# Patient Record
Sex: Male | Born: 1960 | Race: White | Hispanic: No | Marital: Married | Smoking: Never smoker
Health system: Southern US, Community
[De-identification: ages and names within clinical notes are randomized; demographics above are authoritative.]

## PROBLEM LIST (undated history)

## (undated) DIAGNOSIS — K219 Gastro-esophageal reflux disease without esophagitis: Secondary | ICD-10-CM

## (undated) DIAGNOSIS — G5603 Carpal tunnel syndrome, bilateral upper limbs: Secondary | ICD-10-CM

## (undated) HISTORY — DX: Gastro-esophageal reflux disease without esophagitis: K21.9

## (undated) HISTORY — DX: Carpal tunnel syndrome, bilateral upper limbs: G56.03

## (undated) HISTORY — PX: WRIST SURGERY: SHX841

---

## 2002-09-02 ENCOUNTER — Ambulatory Visit (HOSPITAL_BASED_OUTPATIENT_CLINIC_OR_DEPARTMENT_OTHER): Admission: RE | Admit: 2002-09-02 | Discharge: 2002-09-02 | Payer: Self-pay | Admitting: Orthopedic Surgery

## 2002-10-15 ENCOUNTER — Ambulatory Visit (HOSPITAL_BASED_OUTPATIENT_CLINIC_OR_DEPARTMENT_OTHER): Admission: RE | Admit: 2002-10-15 | Discharge: 2002-10-15 | Payer: Self-pay | Admitting: Orthopedic Surgery

## 2012-09-05 ENCOUNTER — Encounter: Payer: Self-pay | Admitting: Physician Assistant

## 2012-09-05 ENCOUNTER — Ambulatory Visit (INDEPENDENT_AMBULATORY_CARE_PROVIDER_SITE_OTHER): Payer: 59 | Admitting: Physician Assistant

## 2012-09-05 VITALS — BP 117/72 | HR 60 | Temp 97.3°F | Ht 71.0 in | Wt 191.4 lb

## 2012-09-05 DIAGNOSIS — K219 Gastro-esophageal reflux disease without esophagitis: Secondary | ICD-10-CM

## 2012-09-05 NOTE — Progress Notes (Signed)
Subjective:     Patient ID: Alan Gill, male   DOB: 03-10-1961, 52 y.o.   MRN: [id number]  HPI Pt with a several month hx of R 4th finger pain and stiffness After further discussion he has prev been seen by Ortho due to a trigger finger of the same He had an injection and sx got better and no more triggering Now he is having pain to the same area Denies any injury He uses OTC NSAIDS intermit for sx  Review of Systems  All other systems reviewed and are negative.       Objective:   Physical Exam  Nursing note and vitals reviewed. No ecchy edema to the finger FROM  w/o triggering today He has TTP along the flexor tendon in the palm Good grip strength Pulses/sensory good     Assessment:     Finger pain/tendonitis     Plan:     Cont OTC NSAIDS Heat/Ice Can try Zostrix OTC If sx cont f/u with Ortho for possible inj

## 2012-09-05 NOTE — Patient Instructions (Signed)
Tendinitis  Tendinitis is swelling and inflammation of the tendons. Tendons are band-like tissues that connect muscle to bone. Tendinitis commonly occurs in the:    Shoulders (rotator cuff).   Heels (Achilles tendon).   Elbows (triceps tendon).  CAUSES  Tendinitis is usually caused by overusing the tendon, muscles, and joints involved. When the tissue surrounding a tendon (synovium) becomes inflamed, it is called tenosynovitis. Tendinitis commonly develops in people whose jobs require repetitive motions.  SYMPTOMS   Pain.   Tenderness.   Mild swelling.  DIAGNOSIS  Tendinitis is usually diagnosed by physical exam. Your caregiver may also order X-rays or other imaging tests.  TREATMENT  Your caregiver may recommend certain medicines or exercises for your treatment.  HOME CARE INSTRUCTIONS    Use a sling or splint for as long as directed by your caregiver until the pain decreases.   Put ice on the injured area.   Put ice in a plastic bag.   Place a towel between your skin and the bag.   Leave the ice on for 15-20 minutes, 3-4 times a day.   Avoid using the limb while the tendon is painful. Perform gentle range of motion exercises only as directed by your caregiver. Stop exercises if pain or discomfort increase, unless directed otherwise by your caregiver.   Only take over-the-counter or prescription medicines for pain, discomfort, or fever as directed by your caregiver.  SEEK MEDICAL CARE IF:    Your pain and swelling increase.   You develop new, unexplained symptoms, especially increased numbness in the hands.  MAKE SURE YOU:    Understand these instructions.   Will watch your condition.   Will get help right away if you are not doing well or get worse.  Document Released: 03/23/2000 Document Revised: 06/18/2011 Document Reviewed: 06/12/2010  [employer] Patient Information 2014 [employer]

## 2013-06-10 ENCOUNTER — Ambulatory Visit: Payer: 59 | Admitting: Family Medicine

## 2013-06-10 VITALS — BP 118/68 | HR 78 | Temp 97.6°F | Ht 71.0 in | Wt 213.0 lb

## 2013-06-10 DIAGNOSIS — Z139 Encounter for screening, unspecified: Secondary | ICD-10-CM

## 2013-06-10 DIAGNOSIS — Z0289 Encounter for other administrative examinations: Secondary | ICD-10-CM

## 2013-06-10 LAB — POCT URINALYSIS DIPSTICK
Bilirubin, UA: NEGATIVE
Blood, UA: NEGATIVE
Glucose, UA: NEGATIVE
Ketones, UA: NEGATIVE
Leukocytes, UA: NEGATIVE
Nitrite, UA: NEGATIVE
Protein, UA: NEGATIVE
Spec Grav, UA: 1.025
Urobilinogen, UA: NEGATIVE
pH, UA: 5

## 2013-06-10 NOTE — Progress Notes (Signed)
   Subjective:    Patient ID: Alan Gill, male    DOB: 05/19/1960, 53 y.o.   MRN: [id number]  HPI  This 53 y.o. male presents for evaluation of DOT PE.  He has no health complaints. He uses corrective lenses to drive.  Review of Systems    No chest pain, SOB, HA, dizziness, vision change, N/V, diarrhea, constipation, dysuria, urinary urgency or frequency, myalgias, arthralgias or rash.  Objective:   Physical Exam  Vital signs noted  Well developed well nourished male.  HEENT - Head atraumatic Normocephalic                Eyes - PERRLA, Conjuctiva - clear Sclera- Clear EOMI                Ears - EAC's Wnl TM's Wnl Gross Hearing WNL                Nose - Nares patent                 Throat - oropharanx wnl Respiratory - Lungs CTA bilateral Cardiac - RRR S1 and S2 without murmur GI - Abdomen soft Nontender and bowel sounds active x 4 Extremities - No edema. Neuro - Grossly intact.  Results for orders placed in visit on 06/10/13  POCT URINALYSIS DIPSTICK      Result Value Ref Range   Color, UA yellow     Clarity, UA clear     Glucose, UA neg     Bilirubin, UA neg     Ketones, UA neg     Spec Grav, UA 1.025     Blood, UA neg     pH, UA 5.0     Protein, UA neg     Urobilinogen, UA negative     Nitrite, UA neg     Leukocytes, UA Negative        Assessment & Plan:  Screening - Plan: POCT urinalysis dipstick  DOT PE - Clear for 2 years.  Deatra CanterWilliam J Oxford FNP

## 2014-02-10 ENCOUNTER — Ambulatory Visit (INDEPENDENT_AMBULATORY_CARE_PROVIDER_SITE_OTHER): Payer: 59 | Admitting: Family Medicine

## 2014-02-10 VITALS — BP 114/69 | HR 61 | Temp 97.2°F | Ht 71.0 in | Wt 203.4 lb

## 2014-02-10 DIAGNOSIS — M6530 Trigger finger, unspecified finger: Secondary | ICD-10-CM

## 2014-02-10 MED ORDER — NAPROXEN 500 MG PO TABS: 500.0000 mg | ORAL_TABLET | Freq: Two times a day (BID) | ORAL | Status: DC

## 2014-02-10 NOTE — Progress Notes (Signed)
   Subjective:    Patient ID: Alan Gill, male    DOB: 01/13/1961, 53 y.o.   MRN: [id number]  HPI C/o trigger finger left 5th finger  Review of Systems  Constitutional: Negative for fever.  HENT: Negative for ear pain.   Eyes: Negative for discharge.  Respiratory: Negative for cough.   Cardiovascular: Negative for chest pain.  Gastrointestinal: Negative for abdominal distention.  Endocrine: Negative for polyuria.  Genitourinary: Negative for difficulty urinating.  Musculoskeletal: Negative for gait problem and neck pain.  Skin: Negative for color change and rash.  Neurological: Negative for speech difficulty and headaches.  Psychiatric/Behavioral: Negative for agitation.       Objective:    BP 114/69 mmHg  Pulse 61  Temp(Src) 97.2 F (36.2 C) (Oral)  Ht 5\' 11"  (1.803 m)  Wt 203 lb 6.4 oz (92.262 kg)  BMI 28.38 kg/m2 Physical Exam  Constitutional: He is oriented to person, place, and time. He appears well-developed and well-nourished.  HENT:  Head: Normocephalic and atraumatic.  Mouth/Throat: Oropharynx is clear and moist.  Eyes: Pupils are equal, round, and reactive to light.  Neck: Normal range of motion. Neck supple.  Cardiovascular: Normal rate and regular rhythm.   No murmur heard. Pulmonary/Chest: Effort normal and breath sounds normal.  Abdominal: Soft. Bowel sounds are normal. There is no tenderness.  Musculoskeletal:  Left 5th trigger finger.  Neurological: He is alert and oriented to person, place, and time.  Skin: Skin is warm and dry.  Psychiatric: He has a normal mood and affect.          Assessment & Plan:     ICD-9-CM ICD-10-CM   1. Trigger finger, acquired 727.03 M65.30 naproxen (NAPROSYN) 500 MG tablet     Ambulatory referral to Orthopedic Surgery     No Follow-up on file.  Deatra CanterWilliam J Mikal Wisman FNP

## 2015-05-26 ENCOUNTER — Encounter: Payer: Self-pay | Admitting: Family Medicine

## 2015-05-26 ENCOUNTER — Ambulatory Visit (INDEPENDENT_AMBULATORY_CARE_PROVIDER_SITE_OTHER): Payer: 59 | Admitting: Family Medicine

## 2015-05-26 VITALS — BP 126/83 | HR 81 | Temp 98.1°F | Ht 71.0 in | Wt 201.8 lb

## 2015-05-26 DIAGNOSIS — J069 Acute upper respiratory infection, unspecified: Secondary | ICD-10-CM

## 2015-05-26 NOTE — Progress Notes (Signed)
   Subjective:    Patient ID: Alan Gill, male    DOB: Sep 15, 1960, 55 y.o.   MRN: [id number]  HPI 55 year old gentleman who was seen in urgent care in [place] over the weekend with symptoms of cough and chills and aching. Denies any fever. Had a flu test which was negative but was treated for a "upper respiratory infection" with Levaquin and prednisone. He feels much better now cough is pretty much subsided.  Patient Active Problem List   Diagnosis Date Noted  . GERD (gastroesophageal reflux disease) 09/05/2012   Outpatient Encounter Prescriptions as of 05/26/2015  Medication Sig  . levofloxacin (LEVAQUIN) 500 MG tablet Take 500 mg by mouth daily.  . [DISCONTINUED] naproxen (NAPROSYN) 500 MG tablet Take 1 tablet (500 mg total) by mouth 2 (two) times daily with a meal.   No facility-administered encounter medications on file as of 05/26/2015.      Review of Systems  Constitutional: Negative.   HENT: Negative.   Eyes: Negative.   Respiratory: Negative.  Negative for shortness of breath.   Cardiovascular: Negative.  Negative for chest pain and leg swelling.  Gastrointestinal: Negative.   Genitourinary: Negative.   Musculoskeletal: Negative.   Skin: Negative.   Neurological: Negative.   Psychiatric/Behavioral: Negative.   All other systems reviewed and are negative.      Objective:   Physical Exam  Constitutional: He appears well-developed and well-nourished.  HENT:  Nose: Nose normal.  Mouth/Throat: Oropharynx is clear and moist.  Neck: Normal range of motion.  Pulmonary/Chest: Effort normal and breath sounds normal.          Assessment & Plan:  1. Acute upper respiratory infection Thinks symptoms have basically resolved. I told him he could probably stop the Levaquin at this point. Eyes sclerae is very healthy. Age 20 there is no suggestion of hypertension and diabetes or lipid issues.  Frederica Kuster MD

## 2015-06-24 ENCOUNTER — Ambulatory Visit: Payer: 59

## 2015-06-27 ENCOUNTER — Encounter: Payer: Self-pay | Admitting: Family Medicine

## 2015-06-27 ENCOUNTER — Ambulatory Visit (INDEPENDENT_AMBULATORY_CARE_PROVIDER_SITE_OTHER): Payer: 59 | Admitting: Family Medicine

## 2015-06-27 ENCOUNTER — Encounter (INDEPENDENT_AMBULATORY_CARE_PROVIDER_SITE_OTHER): Payer: Self-pay

## 2015-06-27 VITALS — BP 124/84 | HR 76 | Temp 98.0°F | Ht 72.0 in | Wt 208.2 lb

## 2015-06-27 DIAGNOSIS — Z111 Encounter for screening for respiratory tuberculosis: Secondary | ICD-10-CM | POA: Diagnosis not present

## 2015-06-27 DIAGNOSIS — Z131 Encounter for screening for diabetes mellitus: Secondary | ICD-10-CM

## 2015-06-27 DIAGNOSIS — Z299 Encounter for prophylactic measures, unspecified: Secondary | ICD-10-CM

## 2015-06-27 DIAGNOSIS — Z13220 Encounter for screening for lipoid disorders: Secondary | ICD-10-CM

## 2015-06-27 DIAGNOSIS — Z0000 Encounter for general adult medical examination without abnormal findings: Secondary | ICD-10-CM

## 2015-06-27 DIAGNOSIS — Z1159 Encounter for screening for other viral diseases: Secondary | ICD-10-CM

## 2015-06-27 DIAGNOSIS — Z114 Encounter for screening for human immunodeficiency virus [HIV]: Secondary | ICD-10-CM

## 2015-06-27 DIAGNOSIS — Z125 Encounter for screening for malignant neoplasm of prostate: Secondary | ICD-10-CM

## 2015-06-27 DIAGNOSIS — Z1211 Encounter for screening for malignant neoplasm of colon: Secondary | ICD-10-CM

## 2015-06-27 NOTE — Progress Notes (Signed)
BP 124/84 mmHg  Pulse 76  Temp(Src) 98 F (36.7 C) (Oral)  Ht 6' (1.829 m)  Wt 208 lb 3.2 oz (94.439 kg)  BMI 28.23 kg/m2   Subjective:    Patient ID: Alan Gill, male    DOB: 1960/11/09, 55 y.o.   MRN: [id number]  HPI: Alan Gill is a 55 y.o. male presenting on 06/27/2015 for Annual Exam   HPI Annual exam and physical Patient is coming in today for annual exam and physical for becoming a foster parent. He denies any chest pain, shortness of breath, headaches or vision issues, abdominal complaints, diarrhea, nausea, vomiting, or joint issues. He denies any health issues or joint issues at all.  Relevant past medical, surgical, family and social history reviewed and updated as indicated. Interim medical history since our last visit reviewed. Allergies and medications reviewed and updated.  Review of Systems  Constitutional: Negative for fever and chills.  HENT: Negative for ear discharge and ear pain.   Eyes: Negative for discharge and visual disturbance.  Respiratory: Negative for chest tightness, shortness of breath and wheezing.   Cardiovascular: Negative for chest pain and leg swelling.  Gastrointestinal: Negative for abdominal pain, diarrhea and constipation.  Endocrine: Negative for cold intolerance and heat intolerance.  Genitourinary: Negative for difficulty urinating.  Musculoskeletal: Negative for back pain and gait problem.  Skin: Negative for rash.  Neurological: Negative for dizziness, syncope, light-headedness and headaches.  All other systems reviewed and are negative.   Per HPI unless specifically indicated above     Medication List    Notice  As of 06/27/2015  4:53 PM   You have not been prescribed any medications.         Objective:    BP 124/84 mmHg  Pulse 76  Temp(Src) 98 F (36.7 C) (Oral)  Ht 6' (1.829 m)  Wt 208 lb 3.2 oz (94.439 kg)  BMI 28.23 kg/m2  Wt Readings from Last 3 Encounters:  06/27/15 208 lb 3.2 oz (94.439 kg)  05/26/15  201 lb 12.8 oz (91.536 kg)  02/10/14 203 lb 6.4 oz (92.262 kg)    Physical Exam  Constitutional: He is oriented to person, place, and time. He appears well-developed and well-nourished. No distress.  HENT:  Right Ear: External ear normal.  Left Ear: External ear normal.  Mouth/Throat: Oropharynx is clear and moist. No oropharyngeal exudate.  Eyes: Conjunctivae and EOM are normal. Pupils are equal, round, and reactive to light. Right eye exhibits no discharge. No scleral icterus.  Neck: Neck supple. No thyromegaly present.  Cardiovascular: Normal rate, regular rhythm, normal heart sounds and intact distal pulses.   No murmur heard. Pulmonary/Chest: Effort normal and breath sounds normal. No respiratory distress. He has no wheezes.  Abdominal: Soft. Bowel sounds are normal. He exhibits no distension. There is no tenderness. There is no rebound.  Musculoskeletal: Normal range of motion. He exhibits no edema.  Lymphadenopathy:    He has no cervical adenopathy.  Neurological: He is alert and oriented to person, place, and time. No cranial nerve deficit. Coordination normal.  Skin: Skin is warm and dry. No rash noted. He is not diaphoretic.  Psychiatric: He has a normal mood and affect. His behavior is normal. Thought content normal.  Vitals reviewed.     Assessment & Plan:   Problem List Items Addressed This Visit    None    Visit Diagnoses    Well adult exam    -  Primary    Need for  prophylactic measure        Relevant Orders    PPD (Completed)    Special screening for malignant neoplasms, colon        Relevant Orders    Ambulatory referral to Gastroenterology    Diabetes mellitus screening        Relevant Orders    CMP14+EGFR    Lipid screening        Relevant Orders    Lipid panel    Prostate cancer screening        Relevant Orders    PSA, total and free    Screening for HIV without presence of risk factors        Relevant Orders    HIV antibody    Need for hepatitis C  screening test        Relevant Orders    Hepatitis C antibody       Follow up plan: Return in about 1 year (around 06/26/2016), or if symptoms worsen or fail to improve.  Counseling provided for all of the vaccine components Orders Placed This Encounter  Procedures  . CMP14+EGFR  . Lipid panel  . Hepatitis C antibody  . HIV antibody  . PSA, total and free  . Ambulatory referral to Gastroenterology  . PPD    Caryl Pina, MD [facility] 06/27/2015, 4:53 PM

## 2015-06-28 LAB — CMP14+EGFR
A/G RATIO: 1.8 (ref 1.2–2.2)
ALT: 15 IU/L (ref 0–44)
AST: 20 IU/L (ref 0–40)
Albumin: 4.4 g/dL (ref 3.5–5.5)
Alkaline Phosphatase: 46 IU/L (ref 39–117)
BUN / CREAT RATIO: 25 — AB (ref 9–20)
BUN: 14 mg/dL (ref 6–24)
Bilirubin Total: 0.4 mg/dL (ref 0.0–1.2)
CO2: 24 mmol/L (ref 18–29)
Calcium: 9.9 mg/dL (ref 8.7–10.2)
Chloride: 99 mmol/L (ref 96–106)
Creatinine, Ser: 0.57 mg/dL — ABNORMAL LOW (ref 0.76–1.27)
GFR calc Af Amer: 136 mL/min/{1.73_m2} (ref >59)
GFR, EST NON AFRICAN AMERICAN: 118 mL/min/{1.73_m2} (ref >59)
GLOBULIN, TOTAL: 2.4 g/dL (ref 1.5–4.5)
Glucose: 84 mg/dL (ref 65–99)
POTASSIUM: 4.1 mmol/L (ref 3.5–5.2)
Sodium: 141 mmol/L (ref 134–144)
TOTAL PROTEIN: 6.8 g/dL (ref 6.0–8.5)

## 2015-06-28 LAB — PSA, TOTAL AND FREE
PROSTATE SPECIFIC AG, SERUM: 0.7 ng/mL (ref 0.0–4.0)
PSA FREE: 0.14 ng/mL
PSA, Free Pct: 20 %

## 2015-06-28 LAB — HIV ANTIBODY (ROUTINE TESTING W REFLEX): HIV SCREEN 4TH GENERATION: NONREACTIVE

## 2015-06-28 LAB — HCV ANTIBODY

## 2015-06-28 LAB — LIPID PANEL
CHOL/HDL RATIO: 2.6 ratio (ref 0.0–5.0)
Cholesterol, Total: 144 mg/dL (ref 100–199)
HDL: 55 mg/dL (ref >39)
LDL CALC: 62 mg/dL (ref 0–99)
TRIGLYCERIDES: 136 mg/dL (ref 0–149)
VLDL Cholesterol Cal: 27 mg/dL (ref 5–40)

## 2015-06-30 ENCOUNTER — Ambulatory Visit: Payer: 59 | Admitting: *Deleted

## 2015-06-30 DIAGNOSIS — Z111 Encounter for screening for respiratory tuberculosis: Secondary | ICD-10-CM

## 2015-06-30 LAB — TB SKIN TEST
Induration: 0 mm
TB SKIN TEST: NEGATIVE

## 2015-06-30 NOTE — Progress Notes (Signed)
Pt came in to have PPD read. PPD negative, form signed and given to pt.

## 2015-07-06 ENCOUNTER — Telehealth: Payer: Self-pay

## 2015-07-06 NOTE — Telephone Encounter (Signed)
I triaged pt and he will need to get some dates that his wife is off and call me back. His problem is gong to be having a ride.

## 2015-07-06 NOTE — Telephone Encounter (Signed)
Patient called back again asking for DS. I told him that she was doing her triages and would get to him as soon as she could and she was aware that he had called.

## 2015-07-06 NOTE — Telephone Encounter (Signed)
Pt received a triage letter from DS. 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 [phone]

## 2015-07-07 NOTE — Telephone Encounter (Signed)
Pt called back and could not work out anything on any dates that his wife is off. He will get some more dates and call when he can to get scheduled.

## 2015-12-20 ENCOUNTER — Other Ambulatory Visit: Payer: Self-pay | Admitting: *Deleted

## 2015-12-20 ENCOUNTER — Telehealth: Payer: Self-pay | Admitting: Family Medicine

## 2015-12-20 DIAGNOSIS — Z1211 Encounter for screening for malignant neoplasm of colon: Secondary | ICD-10-CM

## 2015-12-20 NOTE — Telephone Encounter (Signed)
Referral placed.

## 2015-12-27 ENCOUNTER — Telehealth: Payer: Self-pay

## 2015-12-27 NOTE — Telephone Encounter (Signed)
Pt received a triage letter from DS. Please call him at [phone]

## 2016-01-05 NOTE — Telephone Encounter (Signed)
I spoke to pt and he has had blood in stool and on tissue sometimes. OV scheduled with Tana CoastLeslie Lewis, PA on 01/27/2016 at 8:30 Am.

## 2016-01-05 NOTE — Telephone Encounter (Signed)
Patient called wanting to get scheduled for his tcs.  Routing to [employer]

## 2016-01-27 ENCOUNTER — Ambulatory Visit (INDEPENDENT_AMBULATORY_CARE_PROVIDER_SITE_OTHER): Payer: 59 | Admitting: Gastroenterology

## 2016-01-27 ENCOUNTER — Encounter: Payer: Self-pay | Admitting: Gastroenterology

## 2016-01-27 ENCOUNTER — Other Ambulatory Visit: Payer: Self-pay

## 2016-01-27 DIAGNOSIS — K625 Hemorrhage of anus and rectum: Secondary | ICD-10-CM | POA: Diagnosis not present

## 2016-01-27 MED ORDER — NA SULFATE-K SULFATE-MG SULF 17.5-3.13-1.6 GM/177ML PO SOLN: 1.0000 | ORAL | 0 refills | Status: DC

## 2016-01-27 NOTE — Patient Instructions (Signed)
1. Colonoscopy as scheduled. See separate instructions.  

## 2016-01-27 NOTE — Progress Notes (Signed)
Primary Care Physician:  MILLER, STEPHEN M, MD  Primary Gastroenterologist:  Michael Rourk, MD   Chief Complaint  Patient presents with  . Colonoscopy    HPI:  Alan Gill is a 55 y.o. male here to schedule colonoscopy. This will be his first ever colonoscopy. No family history colon cancer. He reports seeing bright red blood per rectum on occasion. Overall has regular bowel movements. No melena. No rectal pain. No abdominal pain. Rarely has heartburn. No dysphagia, weight loss, vomiting. No regular medications.  No current outpatient prescriptions on file.   No current facility-administered medications for this visit.     Allergies as of 01/27/2016  . (No Known Allergies)    Past Medical History:  Diagnosis Date  . Carpal tunnel syndrome on both sides   . GERD (gastroesophageal reflux disease)     Past Surgical History:  Procedure Laterality Date  . WRIST SURGERY      Family History  Problem Relation Age of Onset  . Healthy Mother   . Healthy Father   . Bladder Cancer Father   . Healthy Sister   . Cancer Maternal Grandmother     breast  . Early death Sister   . Drug abuse Son   . Bladder Cancer Paternal Uncle   . Colon cancer Neg Hx     Social History   Social History  . Marital status: Married    Spouse name: N/A  . Number of children: N/A  . Years of education: N/A   Occupational History  . Not on file.   Social History Main Topics  . Smoking status: Never Smoker  . Smokeless tobacco: Never Used  . Alcohol use No  . Drug use: No  . Sexual activity: Not on file   Other Topics Concern  . Not on file   Social History Narrative   Work [employer] telephone [employer]      ROS:  General: Negative for anorexia, weight loss, fever, chills, fatigue, weakness. Eyes: Negative for vision changes.  ENT: Negative for hoarseness, difficulty swallowing , nasal congestion. CV: Negative for chest pain, angina, palpitations, dyspnea on exertion, peripheral  edema.  Respiratory: Negative for dyspnea at rest, dyspnea on exertion, cough, sputum, wheezing.  GI: See history of present illness. GU:  Negative for dysuria, hematuria, urinary incontinence, urinary frequency, nocturnal urination.  MS: Negative for joint pain, low back pain.  Derm: Negative for rash or itching.  Neuro: Negative for weakness, abnormal sensation, seizure, frequent headaches, memory loss, confusion.  Psych: Negative for anxiety, depression, suicidal ideation, hallucinations.  Endo: Negative for unusual weight change.  Heme: Negative for bruising or bleeding. Allergy: Negative for rash or hives.    Physical Examination:  BP 128/87   Pulse 65   Temp 97 F (36.1 C) (Oral)   Ht 6' (1.829 m)   Wt 203 lb 6.4 oz (92.3 kg)   BMI 27.59 kg/m    General: Well-nourished, well-developed in no acute distress.  Head: Normocephalic, atraumatic.   Eyes: Conjunctiva pink, no icterus. Mouth: Oropharyngeal mucosa moist and pink , no lesions erythema or exudate. Neck: Supple without thyromegaly, masses, or lymphadenopathy.  Lungs: Clear to auscultation bilaterally.  Heart: Regular rate and rhythm, no murmurs rubs or gallops.  Abdomen: Bowel sounds are normal, nontender, nondistended, no hepatosplenomegaly or masses, no abdominal bruits or    hernia , no rebound or guarding.   Rectal: Not performed Extremities: No lower extremity edema. No clubbing or deformities.  Neuro: Alert and oriented x   4 , grossly normal neurologically.  Skin: Warm and dry, no rash or jaundice.   Psych: Alert and cooperative, normal mood and affect.  Labs: Lab Results  Component Value Date   CREATININE 0.57 (L) 06/27/2015   BUN 14 06/27/2015   NA 141 06/27/2015   K 4.1 06/27/2015   CL 99 06/27/2015   CO2 24 06/27/2015   Lab Results  Component Value Date   ALT 15 06/27/2015   AST 20 06/27/2015   ALKPHOS 46 06/27/2015   BILITOT 0.4 06/27/2015   No results found for: WBC, HGB, HCT, MCV,  PLT   Imaging Studies: No results found.

## 2016-01-27 NOTE — Assessment & Plan Note (Signed)
55 year old gentleman presenting with intermittent rectal bleeding. Due for colonoscopy. Phenergan 25 mg IV 30 minutes before the procedure augment conscious sedation.  I have discussed the risks, alternatives, benefits with regards to but not limited to the risk of reaction to medication, bleeding, infection, perforation and the patient is agreeable to proceed. Written consent to be obtained.

## 2016-01-27 NOTE — Progress Notes (Signed)
CC'ED TO PCP 

## 2016-01-30 NOTE — Patient Instructions (Signed)
PA # for TCS  [id number]

## 2016-02-20 ENCOUNTER — Encounter (HOSPITAL_COMMUNITY): Payer: Self-pay | Admitting: *Deleted

## 2016-02-20 ENCOUNTER — Ambulatory Visit (HOSPITAL_COMMUNITY)
Admission: RE | Admit: 2016-02-20 | Discharge: 2016-02-20 | Disposition: A | Discharge status: Home / Self Care | Payer: 59 | Source: Ambulatory Visit | Attending: Internal Medicine | Admitting: Internal Medicine

## 2016-02-20 ENCOUNTER — Encounter (HOSPITAL_COMMUNITY)
Admission: RE | Disposition: A | Discharge status: Home / Self Care | Payer: Self-pay | Source: Ambulatory Visit | Attending: Internal Medicine

## 2016-02-20 DIAGNOSIS — K921 Melena: Secondary | ICD-10-CM | POA: Diagnosis not present

## 2016-02-20 DIAGNOSIS — K640 First degree hemorrhoids: Secondary | ICD-10-CM | POA: Diagnosis not present

## 2016-02-20 DIAGNOSIS — K625 Hemorrhage of anus and rectum: Secondary | ICD-10-CM | POA: Diagnosis present

## 2016-02-20 HISTORY — PX: COLONOSCOPY: SHX5424

## 2016-02-20 SURGERY — COLONOSCOPY
Anesthesia: Moderate Sedation

## 2016-02-20 MED ORDER — MEPERIDINE HCL 100 MG/ML IJ SOLN
INTRAMUSCULAR | Status: AC
  Filled 2016-02-20: qty 2

## 2016-02-20 MED ORDER — MEPERIDINE HCL 100 MG/ML IJ SOLN
INTRAMUSCULAR | Status: DC | PRN
  Administered 2016-02-20: 25 mg via INTRAVENOUS
  Administered 2016-02-20: 50 mg via INTRAVENOUS

## 2016-02-20 MED ORDER — ONDANSETRON HCL 4 MG/2ML IJ SOLN
INTRAMUSCULAR | Status: AC
  Filled 2016-02-20: qty 2

## 2016-02-20 MED ORDER — ONDANSETRON HCL 4 MG/2ML IJ SOLN
INTRAMUSCULAR | Status: DC | PRN
  Administered 2016-02-20: 4 mg via INTRAVENOUS

## 2016-02-20 MED ORDER — SODIUM CHLORIDE 0.9 % IV SOLN
INTRAVENOUS | Status: DC
  Administered 2016-02-20: 11:00:00 via INTRAVENOUS

## 2016-02-20 MED ORDER — MIDAZOLAM HCL 5 MG/5ML IJ SOLN
INTRAMUSCULAR | Status: AC
  Filled 2016-02-20: qty 10

## 2016-02-20 MED ORDER — MIDAZOLAM HCL 5 MG/5ML IJ SOLN
INTRAMUSCULAR | Status: DC | PRN
  Administered 2016-02-20: 2 mg via INTRAVENOUS
  Administered 2016-02-20: 1 mg via INTRAVENOUS

## 2016-02-20 MED ORDER — PROMETHAZINE HCL 25 MG/ML IJ SOLN
INTRAMUSCULAR | Status: AC
  Filled 2016-02-20: qty 1

## 2016-02-20 MED ORDER — PROMETHAZINE HCL 25 MG/ML IJ SOLN
25.0000 mg | Freq: Once | INTRAMUSCULAR | Status: AC
  Administered 2016-02-20: 25 mg via INTRAVENOUS

## 2016-02-20 MED ORDER — SODIUM CHLORIDE 0.9% FLUSH
INTRAVENOUS | Status: AC
  Filled 2016-02-20: qty 10

## 2016-02-20 NOTE — Op Note (Signed)
[facility] Patient Name: Alan Gill Procedure Date: 02/20/2016 12:15 PM MRN: [id number] Date of Birth: 04/23/1960 Attending MD: Gennette Pacobert Michael Rourk , MD CSN: [id number] Age: [id number] Admit Type: Outpatient Procedure:                Colonoscopy - diagnostic Indications:              Hematochezia Providers:                Gennette Pacobert Michael Rourk, MD, Jannett CelestineAnitra Bell, RN, Birder Robsonebra                            [place], Technician Referring MD:              Medicines:                Midazolam 3 mg IV, Meperidine 75 mg IV Complications:            No immediate complications. Estimated Blood Loss:     Estimated blood loss: none. Procedure:                Pre-Anesthesia Assessment:                           - Prior to the procedure, a History and Physical                            was performed, and patient medications and                            allergies were reviewed. The patient's tolerance of                            previous anesthesia was also reviewed. The risks                            and benefits of the procedure and the sedation                            options and risks were discussed with the patient.                            All questions were answered, and informed consent                            was obtained. Prior Anticoagulants: The patient has                            taken no previous anticoagulant or antiplatelet                            agents. ASA Grade Assessment: II - A patient with                            mild systemic disease. After reviewing the risks  and benefits, the patient was deemed in                            satisfactory condition to undergo the procedure.                           After obtaining informed consent, the colonoscope                            was passed under direct vision. Throughout the                            procedure, the patient's blood pressure, pulse, and                            oxygen  saturations were monitored continuously. The                            EC-[id number] ([id number]) scope was introduced through                            the anus and advanced to the the cecum, identified                            by appendiceal orifice and ileocecal valve. The                            colonoscopy was performed without difficulty. The                            patient tolerated the procedure well. The quality                            of the bowel preparation was adequate. The entire                            colon was well visualized. The ileocecal valve,                            appendiceal orifice, and rectum were photographed. Scope In: 12:29:26 PM Scope Out: 12:41:20 PM Scope Withdrawal Time: 0 hours 8 minutes 29 seconds  Total Procedure Duration: 0 hours 11 minutes 54 seconds  Findings:      The perianal and digital rectal examinations were normal.      Internal hemorrhoids were found during retroflexion. The hemorrhoids       were Grade I (internal hemorrhoids that do not prolapse).      The colon (entire examined portion) appeared normal.      No additional abnormalities were found on retroflexion. Impression:               - Internal hemorrhoids - likely source of                            hemtochezia                           -  The entire examined colon is normal.                           - No specimens collected. Moderate Sedation:      Moderate (conscious) sedation was administered by the endoscopy nurse       and supervised by the endoscopist. The following parameters were       monitored: oxygen saturation, heart rate, blood pressure, respiratory       rate, EKG, adequacy of pulmonary ventilation, and response to care.       Total physician intraservice time was 17 minutes. Recommendation:           - Patient has a contact number available for                            emergencies. The signs and symptoms of potential                             delayed complications were discussed with the                            patient. Return to normal activities tomorrow.                            Written discharge instructions were provided to the                            patient.                           - Resume previous diet.                           - Continue present medications. Two-week course of                            Anusol HC suppositories 1 per rectum. Benefiber 1                            tablespoon twice daly to regimen.                           - Repeat colonoscopy in 10 years for screening                            purposes.                           - Return to GI office in 8 weeks. Patient would be                            a reasonably good hemorrhoid banding candidate if                            needed. Procedure Code(s):        --- Professional ---  [id number], Colonoscopy, flexible; diagnostic, including                            collection of specimen(s) by brushing or washing,                            when performed (separate procedure)                           99152, Moderate sedation services provided by the                            same physician or other qualified health care                            professional performing the diagnostic or                            therapeutic service that the sedation supports,                            requiring the presence of an independent trained                            observer to assist in the monitoring of the                            patient's level of consciousness and physiological                            status; initial 15 minutes of intraservice time,                            patient age 5 years or older Diagnosis Code(s):        --- Professional ---                           K64.0, First degree hemorrhoids                           K92.1, Melena (includes Hematochezia) CPT copyright 2016 American Medical  Association. All rights reserved. The codes documented in this report are preliminary and upon coder review may  be revised to meet current compliance requirements. Gerrit Friendsobert M. Rourk, MD Gennette Pacobert Michael Rourk, MD 02/20/2016 12:53:21 PM This report has been signed electronically. Number of Addenda: 0

## 2016-02-20 NOTE — H&P (View-Only) (Signed)
Primary Care Physician:  Frederica KusterMILLER, STEPHEN M, MD  Primary Gastroenterologist:  Roetta SessionsMichael Rourk, MD   Chief Complaint  Patient presents with  . Colonoscopy    HPI:  Alan Gill is a 55 y.o. male here to schedule colonoscopy. This will be his first ever colonoscopy. No family history colon cancer. He reports seeing bright red blood per rectum on occasion. Overall has regular bowel movements. No melena. No rectal pain. No abdominal pain. Rarely has heartburn. No dysphagia, weight loss, vomiting. No regular medications.  No current outpatient prescriptions on file.   No current facility-administered medications for this visit.     Allergies as of 01/27/2016  . (No Known Allergies)    Past Medical History:  Diagnosis Date  . Carpal tunnel syndrome on both sides   . GERD (gastroesophageal reflux disease)     Past Surgical History:  Procedure Laterality Date  . WRIST SURGERY      Family History  Problem Relation Age of Onset  . Healthy Mother   . Healthy Father   . Bladder Cancer Father   . Healthy Sister   . Cancer Maternal Grandmother     breast  . Early death Sister   . Drug abuse Son   . Bladder Cancer Paternal Uncle   . Colon cancer Neg Hx     Social History   Social History  . Marital status: Married    Spouse name: N/A  . Number of children: N/A  . Years of education: N/A   Occupational History  . Not on file.   Social History Main Topics  . Smoking status: Never Smoker  . Smokeless tobacco: Never Used  . Alcohol use No  . Drug use: No  . Sexual activity: Not on file   Other Topics Concern  . Not on file   Social History Narrative   Work [employer]      ROS:  General: Negative for anorexia, weight loss, fever, chills, fatigue, weakness. Eyes: Negative for vision changes.  ENT: Negative for hoarseness, difficulty swallowing , nasal congestion. CV: Negative for chest pain, angina, palpitations, dyspnea on exertion, peripheral  edema.  Respiratory: Negative for dyspnea at rest, dyspnea on exertion, cough, sputum, wheezing.  GI: See history of present illness. GU:  Negative for dysuria, hematuria, urinary incontinence, urinary frequency, nocturnal urination.  MS: Negative for joint pain, low back pain.  Derm: Negative for rash or itching.  Neuro: Negative for weakness, abnormal sensation, seizure, frequent headaches, memory loss, confusion.  Psych: Negative for anxiety, depression, suicidal ideation, hallucinations.  Endo: Negative for unusual weight change.  Heme: Negative for bruising or bleeding. Allergy: Negative for rash or hives.    Physical Examination:  BP 128/87   Pulse 65   Temp 97 F (36.1 C) (Oral)   Ht 6' (1.829 m)   Wt 203 lb 6.4 oz (92.3 kg)   BMI 27.59 kg/m    General: Well-nourished, well-developed in no acute distress.  Head: Normocephalic, atraumatic.   Eyes: Conjunctiva pink, no icterus. Mouth: Oropharyngeal mucosa moist and pink , no lesions erythema or exudate. Neck: Supple without thyromegaly, masses, or lymphadenopathy.  Lungs: Clear to auscultation bilaterally.  Heart: Regular rate and rhythm, no murmurs rubs or gallops.  Abdomen: Bowel sounds are normal, nontender, nondistended, no hepatosplenomegaly or masses, no abdominal bruits or    hernia , no rebound or guarding.   Rectal: Not performed Extremities: No lower extremity edema. No clubbing or deformities.  Neuro: Alert and oriented x  4 , grossly normal neurologically.  Skin: Warm and dry, no rash or jaundice.   Psych: Alert and cooperative, normal mood and affect.  Labs: Lab Results  Component Value Date   CREATININE 0.57 (L) 06/27/2015   BUN 14 06/27/2015   NA 141 06/27/2015   K 4.1 06/27/2015   CL 99 06/27/2015   CO2 24 06/27/2015   Lab Results  Component Value Date   ALT 15 06/27/2015   AST 20 06/27/2015   ALKPHOS 46 06/27/2015   BILITOT 0.4 06/27/2015   No results found for: WBC, HGB, HCT, MCV,  PLT   Imaging Studies: No results found.

## 2016-02-20 NOTE — Interval H&P Note (Signed)
History and Physical Interval Note:  02/20/2016 12:22 PM  Margaretmary DysEugene Altamura  has presented today for surgery, with the diagnosis of RECTAL BLEEDING  The various methods of treatment have been discussed with the patient and family. After consideration of risks, benefits and other options for treatment, the patient has consented to  Procedure(s) with comments: COLONOSCOPY (N/A) - 200 as a surgical intervention .  The patient's history has been reviewed, patient examined, no change in status, stable for surgery.  I have reviewed the patient's chart and labs.  Questions were answered to the patient's satisfaction.      No change. Diagnostic colonoscopy for plan.  The risks, benefits, limitations, alternatives and imponderables have been reviewed with the patient. Questions have been answered. All parties are agreeable.  Eula Listenobert Emylie Amster

## 2016-02-20 NOTE — Discharge Instructions (Addendum)
Colonoscopy Discharge Instructions  Read the instructions outlined below and refer to this sheet in the next few weeks. These discharge instructions provide you with general information on caring for yourself after you leave the hospital. Your doctor may also give you specific instructions. While your treatment has been planned according to the most current medical practices available, unavoidable complications occasionally occur. If you have any problems or questions after discharge, call Dr. Jena Gaussourk at [phone]. ACTIVITY  You may resume your regular activity, but move at a slower pace for the next 24 hours.   Take frequent rest periods for the next 24 hours.   Walking will help get rid of the air and reduce the bloated feeling in your belly (abdomen).   No driving for 24 hours (because of the medicine (anesthesia) used during the test).    Do not sign any important legal documents or operate any machinery for 24 hours (because of the anesthesia used during the test).  NUTRITION  Drink plenty of fluids.   You may resume your normal diet as instructed by your doctor.   Begin with a light meal and progress to your normal diet. Heavy or fried foods are harder to digest and may make you feel sick to your stomach (nauseated).   Avoid alcoholic beverages for 24 hours or as instructed.  MEDICATIONS  You may resume your normal medications unless your doctor tells you otherwise.  WHAT YOU CAN EXPECT TODAY  Some feelings of bloating in the abdomen.   Passage of more gas than usual.   Spotting of blood in your stool or on the toilet paper.  IF YOU HAD POLYPS REMOVED DURING THE COLONOSCOPY:  No aspirin products for 7 days or as instructed.   No alcohol for 7 days or as instructed.   Eat a soft diet for the next 24 hours.  FINDING OUT THE RESULTS OF YOUR TEST Not all test results are available during your visit. If your test results are not back during the visit, make an appointment  with your caregiver to find out the results. Do not assume everything is normal if you have not heard from your caregiver or the medical facility. It is important for you to follow up on all of your test results.  SEEK IMMEDIATE MEDICAL ATTENTION IF:  You have more than a spotting of blood in your stool.   Your belly is swollen (abdominal distention).   You are nauseated or vomiting.   You have a temperature over 101.   You have abdominal pain or discomfort that is severe or gets worse throughout the day.      Hemorrhoid information provided  Pamphlet on hemorrhoid banding provided  Repeat colonoscopy for screening purposes in 10 years  Two-week course of Anusol suppositories 1 per rectum at bedtime  Benefiber 1 tablespoon twice daily  If bleeding not improved, can consider hemorrhoid banding in the office. Office visit with us in 6-8 weeks  Hemorrhoids Hemorrhoids are swollen veins around the rectum or anus. There are two types of hemorrhoids:   Internal hemorrhoids. These occur in the veins just inside the rectum. They may poke through to the outside and become irritated and painful.  External hemorrhoids. These occur in the veins outside the anus and can be felt as a painful swelling or hard lump near the anus. CAUSES  Pregnancy.   Obesity.   Constipation or diarrhea.   Straining to have a bowel movement.   Sitting for long periods on the  toilet.  Heavy lifting or other activity that caused you to strain.  Anal intercourse. SYMPTOMS   Pain.   Anal itching or irritation.   Rectal bleeding.   Fecal leakage.   Anal swelling.   One or more lumps around the anus.  DIAGNOSIS  Your caregiver may be able to diagnose hemorrhoids by visual examination. Other examinations or tests that may be performed include:   Examination of the rectal area with a gloved hand (digital rectal exam).   Examination of anal canal using a small tube (scope).   A  blood test if you have lost a significant amount of blood.  A test to look inside the colon (sigmoidoscopy or colonoscopy). TREATMENT Most hemorrhoids can be treated at home. However, if symptoms do not seem to be getting better or if you have a lot of rectal bleeding, your caregiver may perform a procedure to help make the hemorrhoids get smaller or remove them completely. Possible treatments include:   Placing a rubber band at the base of the hemorrhoid to cut off the circulation (rubber band ligation).   Injecting a chemical to shrink the hemorrhoid (sclerotherapy).   Using a tool to burn the hemorrhoid (infrared light therapy).   Surgically removing the hemorrhoid (hemorrhoidectomy).   Stapling the hemorrhoid to block blood flow to the tissue (hemorrhoid stapling).  HOME CARE INSTRUCTIONS   Eat foods with fiber, such as whole grains, beans, nuts, fruits, and vegetables. Ask your doctor about taking products with added fiber in them (fibersupplements).  Increase fluid intake. Drink enough water and fluids to keep your urine clear or pale yellow.   Exercise regularly.   Go to the bathroom when you have the urge to have a bowel movement. Do not wait.   Avoid straining to have bowel movements.   Keep the anal area dry and clean. Use wet toilet paper or moist towelettes after a bowel movement.   Medicated creams and suppositories may be used or applied as directed.   Only take over-the-counter or prescription medicines as directed by your caregiver.   Take warm sitz baths for 15-20 minutes, 3-4 times a day to ease pain and discomfort.   Place ice packs on the hemorrhoids if they are tender and swollen. Using ice packs between sitz baths may be helpful.   Put ice in a plastic bag.   Place a towel between your skin and the bag.   Leave the ice on for 15-20 minutes, 3-4 times a day.   Do not use a donut-shaped pillow or sit on the toilet for long periods. This  increases blood pooling and pain.  SEEK MEDICAL CARE IF:  You have increasing pain and swelling that is not controlled by treatment or medicine.  You have uncontrolled bleeding.  You have difficulty or you are unable to have a bowel movement.  You have pain or inflammation outside the area of the hemorrhoids. MAKE SURE YOU:  Understand these instructions.  Will watch your condition.  Will get help right away if you are not doing well or get worse.   This information is not intended to replace advice given to you by your health care provider. Make sure you discuss any questions you have with your health care provider.   Document Released: 03/23/2000 Document Revised: 03/12/2012 Document Reviewed: 01/29/2012 Elsevier Interactive Patient Education [employer]2016 [employer]

## 2016-02-22 ENCOUNTER — Encounter (HOSPITAL_COMMUNITY): Payer: Self-pay | Admitting: Internal Medicine

## 2016-03-13 ENCOUNTER — Encounter: Payer: Self-pay | Admitting: Nurse Practitioner

## 2016-04-10 ENCOUNTER — Ambulatory Visit: Payer: 59 | Admitting: Nurse Practitioner

## 2016-04-12 ENCOUNTER — Ambulatory Visit: Payer: 59 | Admitting: Nurse Practitioner

## 2016-05-07 ENCOUNTER — Ambulatory Visit: Payer: 59 | Admitting: Nurse Practitioner

## 2020-05-23 ENCOUNTER — Other Ambulatory Visit: Payer: Self-pay

## 2020-05-23 ENCOUNTER — Ambulatory Visit (INDEPENDENT_AMBULATORY_CARE_PROVIDER_SITE_OTHER): Payer: 59 | Admitting: Urology

## 2020-05-23 ENCOUNTER — Ambulatory Visit (HOSPITAL_COMMUNITY)
Admission: RE | Admit: 2020-05-23 | Discharge: 2020-05-23 | Disposition: A | Discharge status: Home / Self Care | Payer: 59 | Source: Ambulatory Visit | Attending: Urology | Admitting: Urology

## 2020-05-23 ENCOUNTER — Encounter: Payer: Self-pay | Admitting: Urology

## 2020-05-23 ENCOUNTER — Encounter (HOSPITAL_COMMUNITY)
Admission: RE | Admit: 2020-05-23 | Discharge: 2020-05-23 | Disposition: A | Discharge status: Home / Self Care | Payer: 59 | Source: Ambulatory Visit | Attending: Urology | Admitting: Urology

## 2020-05-23 ENCOUNTER — Encounter (HOSPITAL_COMMUNITY): Payer: Self-pay

## 2020-05-23 ENCOUNTER — Other Ambulatory Visit (HOSPITAL_COMMUNITY)
Admission: RE | Admit: 2020-05-23 | Discharge: 2020-05-23 | Disposition: A | Discharge status: Home / Self Care | Payer: 59 | Source: Ambulatory Visit | Attending: Urology | Admitting: Urology

## 2020-05-23 VITALS — BP 133/72 | HR 79 | Temp 98.5°F | Ht 72.0 in | Wt 221.0 lb

## 2020-05-23 DIAGNOSIS — N200 Calculus of kidney: Secondary | ICD-10-CM | POA: Insufficient documentation

## 2020-05-23 DIAGNOSIS — Z20822 Contact with and (suspected) exposure to covid-19: Secondary | ICD-10-CM | POA: Insufficient documentation

## 2020-05-23 DIAGNOSIS — Z01812 Encounter for preprocedural laboratory examination: Secondary | ICD-10-CM | POA: Insufficient documentation

## 2020-05-23 LAB — URINALYSIS, ROUTINE W REFLEX MICROSCOPIC
Bilirubin, UA: NEGATIVE
Glucose, UA: NEGATIVE
Ketones, UA: NEGATIVE
Nitrite, UA: NEGATIVE
Specific Gravity, UA: 1.02 (ref 1.005–1.030)
Urobilinogen, Ur: 0.2 mg/dL (ref 0.2–1.0)
pH, UA: 5 (ref 5.0–7.5)

## 2020-05-23 LAB — MICROSCOPIC EXAMINATION
RBC, Urine: >30 /hpf — AB (ref 0–2)
Renal Epithel, UA: NONE SEEN /hpf

## 2020-05-23 MED ORDER — OXYCODONE-ACETAMINOPHEN 5-325 MG PO TABS: 1.0000 | ORAL_TABLET | Freq: Four times a day (QID) | ORAL | 0 refills | Status: DC | PRN

## 2020-05-23 MED ORDER — ONDANSETRON 4 MG PO TBDP: 4.0000 mg | ORAL_TABLET | Freq: Three times a day (TID) | ORAL | 0 refills | Status: DC | PRN

## 2020-05-23 NOTE — Patient Instructions (Signed)
Your procedure is scheduled on: 05/24/2020  Report to [facility] at   9:00  AM.  Call this number if you have problems the morning of surgery: [phone]   Remember:   Do not Eat or Drink after midnight         No Smoking the morning of surgery  :  Take these medicines the morning of surgery with A SIP OF WATER: oxycodone and flomax if needed   Do not wear jewelry, make-up or nail polish.  Do not wear lotions, powders, or perfumes. You may wear deodorant.  Do not shave 48 hours prior to surgery. Men may shave face and neck.  Do not bring valuables to the hospital.  Contacts, dentures or bridgework may not be worn into surgery.  Leave suitcase in the car. After surgery it may be brought to your room.  For patients admitted to the hospital, checkout time is 11:00 AM the day of discharge.   Patients discharged the day of surgery will not be allowed to drive home.     Lithotripsy, Care After This sheet gives you information about how to care for yourself after your procedure. Your health care provider may also give you more specific instructions. If you have problems or questions, contact your health care provider. What can I expect after the procedure? After the procedure, it is common to have:  Some blood in your urine. This should only last for a few days.  Soreness in your back, sides, or upper abdomen for a few days.  Blotches or bruises on the area where the shock wave entered the skin.  Pain, discomfort, or nausea when pieces (fragments) of the kidney stone move through the tube that carries urine from the kidney to the bladder (ureter). Stone fragments may pass soon after the procedure, but they may continue to pass for up to 4-8 weeks. ? If you have severe pain or nausea, contact your health care provider. This may be caused by a large stone that was not broken up, and this may mean that you need more treatment.  Some pain or discomfort during urination.  Some pain or  discomfort in the lower abdomen or (in men) at the base of the penis. Follow these instructions at home: Medicines  Take over-the-counter and prescription medicines only as told by your health care provider.  If you were prescribed an antibiotic medicine, take it as told by your health care provider. Do not stop taking the antibiotic even if you start to feel better.  Ask your health care provider if the medicine prescribed to you requires you to avoid driving or using machinery. Eating and drinking  Drink enough fluid to keep your urine pale yellow. This helps any remaining pieces of the stone to pass. It can also help prevent new stones from forming.  Eat plenty of fresh fruits and vegetables.  Follow instructions from your health care provider about eating or drinking restrictions. You may be instructed to: ? Reduce how much salt (sodium) you eat or drink. Check ingredients and nutrition facts on packaged foods and beverages to see how much sodium they contain. ? Reduce how much meat you eat.  Eat the recommended amount of calcium for your age and gender. Ask your health care provider how much calcium you should have.      General instructions  Get plenty of rest.  Return to your normal activities as told by your health care provider. Ask your health care provider what  activities are safe for you. Most people can resume normal activities 1-2 days after the procedure.  If you were given a sedative during the procedure, it can affect you for several hours. Do not drive or operate machinery until your health care provider says that it is safe.  Your health care provider may direct you to lie in a certain position (postural drainage) and tap firmly (percuss) over your kidney area to help stone fragments pass. Follow instructions as told by your health care provider.  If directed, strain all urine through the strainer that was provided by your health care provider. ? Keep all fragments  for your health care provider to see. Any stones that are found may be sent to a medical lab for examination. The stone may be as small as a grain of salt.  Keep all follow-up visits as told by your health care provider. This is important. Contact a health care provider if:  You have a fever or chills.  You have nausea that is severe or does not go away.  You have any of these urinary symptoms: ? Blood in your urine for longer than your health care provider told you to expect. ? Urine that smells bad or unusual. ? Feeling a strong urge to urinate after emptying your bladder. ? Pain or burning with urination that does not go away. ? Urinating more often than usual and this does not go away.  You have a stent and it comes out. Get help right away if:  You have severe pain in your back, sides, or upper abdomen.  You have any of these urinary symptoms: ? Severe pain while urinating. ? More blood in your urine or having blood in your urine when you did not before. ? Passing blood clots in your urine. ? Passing only a small amount of urine or being unable to pass any urine at all.  You have severe nausea that leads to persistent vomiting.  You faint. Summary  After this procedure, it is common to have some pain, discomfort, or nausea when pieces (fragments) of the kidney stone move through the tube that carries urine from the kidney to the bladder (ureter). If this pain or nausea is severe, however, you should contact your health care provider.  Return to your normal activities as told by your health care provider. Ask your health care provider what activities are safe for you.  Drink enough fluid to keep your urine pale yellow. This helps any remaining pieces of the stone to pass, and it can help prevent new stones from forming.  If directed, strain your urine and keep all fragments for your health care provider to see. Fragments or stones may be as small as a grain of salt.  Get  help right away if you have severe pain in your back, sides, or upper abdomen, or if you have severe pain while urinating. This information is not intended to replace advice given to you by your health care provider. Make sure you discuss any questions you have with your health care provider. Document Revised: 01/07/2019 Document Reviewed: 01/07/2019 Elsevier Patient Education  2021 [employer]  Monitored Anesthesia Care, Care After This sheet gives you information about how to care for yourself after your procedure. Your health care provider may also give you more specific instructions. If you have problems or questions, contact your health care provider. What can I expect after the procedure? After the procedure, it is common to have:  Tiredness.  Forgetfulness about what happened after the procedure.  Impaired judgment for important decisions.  Nausea or vomiting.  Some difficulty with balance. Follow these instructions at home: For the time period you were told by your health care provider:  Rest as needed.  Do not participate in activities where you could fall or become injured.  Do not drive or use machinery.  Do not drink alcohol.  Do not take sleeping pills or medicines that cause drowsiness.  Do not make important decisions or sign legal documents.  Do not take care of children on your own.      Eating and drinking  Follow the diet that is recommended by your health care provider.  Drink enough fluid to keep your urine pale yellow.  If you vomit: ? Drink water, juice, or soup when you can drink without vomiting. ? Make sure you have little or no nausea before eating solid foods. General instructions  Have a responsible adult stay with you for the time you are told. It is important to have someone help care for you until you are awake and alert.  Take over-the-counter and prescription medicines only as told by your health care provider.  If you have  sleep apnea, surgery and certain medicines can increase your risk for breathing problems. Follow instructions from your health care provider about wearing your sleep device: ? Anytime you are sleeping, including during daytime naps. ? While taking prescription pain medicines, sleeping medicines, or medicines that make you drowsy.  Avoid smoking.  Keep all follow-up visits as told by your health care provider. This is important. Contact a health care provider if:  You keep feeling nauseous or you keep vomiting.  You feel light-headed.  You are still sleepy or having trouble with balance after 24 hours.  You develop a rash.  You have a fever.  You have redness or swelling around the IV site. Get help right away if:  You have trouble breathing.  You have new-onset confusion at home. Summary  For several hours after your procedure, you may feel tired. You may also be forgetful and have poor judgment.  Have a responsible adult stay with you for the time you are told. It is important to have someone help care for you until you are awake and alert.  Rest as told. Do not drive or operate machinery. Do not drink alcohol or take sleeping pills.  Get help right away if you have trouble breathing, or if you suddenly become confused. This information is not intended to replace advice given to you by your health care provider. Make sure you discuss any questions you have with your health care provider. Document Revised: 12/10/2019 Document Reviewed: 02/26/2019 Elsevier Patient Education  2021 [employer].

## 2020-05-23 NOTE — Progress Notes (Signed)
05/23/2020 12:10 PM   Alan Gill May 29, 1960 [id number]  Referring provider: Dettinger, Elige Radon, MD [address] [place],  [place] [address]  nephrolithiasis  HPI: Alan Gill is a 59yo here for evaluation of nephrolithiasis. Starting 1 week ago he developed right flank pain with associated nausea and worsening urinary urgency and frequency. He presented to the ER at [facility] on 05/21/2020. and was diagnosed with a 74mm right distal ureteral calculus. No fevers. He continues to have nausea and vomiting. The pain is intermittent, sharp, moderate and nonraditing. He has associated nausea and vomiting. No other associated symptoms. KUb from today shows a 2mm right mid ureteral calculus   PMH: Past Medical History:  Diagnosis Date  . Carpal tunnel syndrome on both sides   . GERD (gastroesophageal reflux disease)     Surgical History: Past Surgical History:  Procedure Laterality Date  . COLONOSCOPY N/A 02/20/2016   Procedure: COLONOSCOPY;  Surgeon: Corbin Ade, MD;  Location: [facility];  Service: Endoscopy;  Laterality: N/A;  200  . WRIST SURGERY      Home Medications:  Allergies as of 05/23/2020   No Known Allergies     Medication List       Accurate as of May 23, 2020 12:10 PM. If you have any questions, ask your nurse or doctor.        ketorolac 10 MG tablet Commonly known as: TORADOL Take 10 mg by mouth every 6 (six) hours as needed.   Melatonin 10 MG Tabs Take 10 mg by mouth at bedtime as needed (sleep).   ondansetron 4 MG disintegrating tablet Commonly known as: ZOFRAN-ODT Take 4 mg by mouth every 8 (eight) hours as needed.   oxyCODONE-acetaminophen 5-325 MG tablet Commonly known as: PERCOCET/ROXICET Take 1 tablet by mouth 4 (four) times daily as needed.   tadalafil 5 MG tablet Commonly known as: CIALIS Take 5 mg by mouth daily.   tamsulosin 0.4 MG Caps capsule Commonly known as: FLOMAX Take 0.4 mg by mouth daily.       Allergies: No  Known Allergies  Family History: Family History  Problem Relation Age of Onset  . Healthy Mother   . Liver cancer Mother   . Healthy Father   . Bladder Cancer Father   . Healthy Sister   . Cancer Maternal Grandmother        breast  . Early death Sister   . Drug abuse Son   . Bladder Cancer Paternal Uncle   . Colon cancer Neg Hx     Social History:  reports that he has never smoked. He has never used smokeless tobacco. He reports that he does not drink alcohol and does not use drugs.  ROS: All other review of systems were reviewed and are negative except what is noted above in HPI  Physical Exam: BP 133/72   Pulse 79   Temp 98.5 F (36.9 C)   Ht 6' (1.829 m)   Wt 221 lb (100.2 kg)   BMI 29.97 kg/m   Constitutional:  Alert and oriented, No acute distress. HEENT: [place] AT, moist mucus membranes.  Trachea midline, no masses. Cardiovascular: No clubbing, cyanosis, or edema. Respiratory: Normal respiratory effort, no increased work of breathing. GI: Abdomen is soft, nontender, nondistended, no abdominal masses GU: No CVA tenderness.  Lymph: No cervical or inguinal lymphadenopathy. Skin: No rashes, bruises or suspicious lesions. Neurologic: Grossly intact, no focal deficits, moving all 4 extremities. Psychiatric: Normal mood and affect.  Laboratory  Data: No results found for: WBC, HGB, HCT, MCV, PLT  Lab Results  Component Value Date   CREATININE 0.57 (L) 06/27/2015    No results found for: PSA  No results found for: TESTOSTERONE  No results found for: HGBA1C  Urinalysis    Component Value Date/Time   BILIRUBINUR neg 06/10/2013 0956   PROTEINUR neg 06/10/2013 0956   UROBILINOGEN negative 06/10/2013 0956   NITRITE neg 06/10/2013 0956   LEUKOCYTESUR Negative 06/10/2013 0956    No results found for: LABMICR, WBCUA, RBCUA, LABEPIT, MUCUS, BACTERIA  Pertinent Imaging: KUB today: Images reviewed and discussed with the patient No results found for this or any  previous visit.  No results found for this or any previous visit.  No results found for this or any previous visit.  No results found for this or any previous visit.  No results found for this or any previous visit.  No results found for this or any previous visit.  No results found for this or any previous visit.  No results found for this or any previous visit.   Assessment & Plan:    1. Nephrolithiasis -We discussed the management of kidney stones. These options include observation, ureteroscopy, shockwave lithotripsy (ESWL) and percutaneous nephrolithotomy (PCNL). We discussed which options are relevant to the patient's stone(s). We discussed the natural history of kidney stones as well as the complications of untreated stones and the impact on quality of life without treatment as well as with each of the above listed treatments. We also discussed the efficacy of each treatment in its ability to clear the stone burden. With any of these management options I discussed the signs and symptoms of infection and the need for emergent treatment should these be experienced. For each option we discussed the ability of each procedure to clear the patient of their stone burden.   For observation I described the risks which include but are not limited to silent renal damage, life-threatening infection, need for emergent surgery, failure to pass stone and pain.   For ureteroscopy I described the risks which include bleeding, infection, damage to contiguous structures, positioning injury, ureteral stricture, ureteral avulsion, ureteral injury, need for prolonged ureteral stent, inability to perform ureteroscopy, need for an interval procedure, inability to clear stone burden, stent discomfort/pain, heart attack, stroke, pulmonary embolus and the inherent risks with general anesthesia.   For shockwave lithotripsy I described the risks which include arrhythmia, kidney contusion, kidney hemorrhage, need  for transfusion, pain, inability to adequately break up stone, inability to pass stone fragments, Steinstrasse, infection associated with obstructing stones, need for alternate surgical procedure, need for repeat shockwave lithotripsy, MI, CVA, PE and the inherent risks with anesthesia/conscious sedation.   For PCNL I described the risks including positioning injury, pneumothorax, hydrothorax, need for chest tube, inability to clear stone burden, renal laceration, arterial venous fistula or malformation, need for embolization of kidney, loss of kidney or renal function, need for repeat procedure, need for prolonged nephrostomy tube, ureteral avulsion, MI, CVA, PE and the inherent risks of general anesthesia.   - The patient would like to proceed with right ESWL   No follow-ups on file.  Magdelene Ruark, MD  [facility] [place]   

## 2020-05-23 NOTE — H&P (View-Only) (Signed)
05/23/2020 12:10 PM   Margaretmary Dys May 29, 1960 [id number]  Referring provider: Dettinger, Elige Radon, MD [address] [place],  [place] [address]  nephrolithiasis  HPI: Alan Gill is a 60yo here for evaluation of nephrolithiasis. Starting 1 week ago he developed right flank pain with associated nausea and worsening urinary urgency and frequency. He presented to the ER at [facility] on 05/21/2020. and was diagnosed with a 74mm right distal ureteral calculus. No fevers. He continues to have nausea and vomiting. The pain is intermittent, sharp, moderate and nonraditing. He has associated nausea and vomiting. No other associated symptoms. KUb from today shows a 2mm right mid ureteral calculus   PMH: Past Medical History:  Diagnosis Date  . Carpal tunnel syndrome on both sides   . GERD (gastroesophageal reflux disease)     Surgical History: Past Surgical History:  Procedure Laterality Date  . COLONOSCOPY N/A 02/20/2016   Procedure: COLONOSCOPY;  Surgeon: Corbin Ade, MD;  [facility];  Service: Endoscopy;  Laterality: N/A;  200  . WRIST SURGERY      Home Medications:  Allergies as of 05/23/2020   No Known Allergies     Medication List       Accurate as of May 23, 2020 12:10 PM. If you have any questions, ask your nurse or doctor.        ketorolac 10 MG tablet Commonly known as: TORADOL Take 10 mg by mouth every 6 (six) hours as needed.   Melatonin 10 MG Tabs Take 10 mg by mouth at bedtime as needed (sleep).   ondansetron 4 MG disintegrating tablet Commonly known as: ZOFRAN-ODT Take 4 mg by mouth every 8 (eight) hours as needed.   oxyCODONE-acetaminophen 5-325 MG tablet Commonly known as: PERCOCET/ROXICET Take 1 tablet by mouth 4 (four) times daily as needed.   tadalafil 5 MG tablet Commonly known as: CIALIS Take 5 mg by mouth daily.   tamsulosin 0.4 MG Caps capsule Commonly known as: FLOMAX Take 0.4 mg by mouth daily.       Allergies: No  Known Allergies  Family History: Family History  Problem Relation Age of Onset  . Healthy Mother   . Liver cancer Mother   . Healthy Father   . Bladder Cancer Father   . Healthy Sister   . Cancer Maternal Grandmother        breast  . Early death Sister   . Drug abuse Son   . Bladder Cancer Paternal Uncle   . Colon cancer Neg Hx     Social History:  reports that he has never smoked. He has never used smokeless tobacco. He reports that he does not drink alcohol and does not use drugs.  ROS: All other review of systems were reviewed and are negative except what is noted above in HPI  Physical Exam: BP 133/72   Pulse 79   Temp 98.5 F (36.9 C)   Ht 6' (1.829 m)   Wt 221 lb (100.2 kg)   BMI 29.97 kg/m   Constitutional:  Alert and oriented, No acute distress. HEENT: [place] AT, moist mucus membranes.  Trachea midline, no masses. Cardiovascular: No clubbing, cyanosis, or edema. Respiratory: Normal respiratory effort, no increased work of breathing. GI: Abdomen is soft, nontender, nondistended, no abdominal masses GU: No CVA tenderness.  Lymph: No cervical or inguinal lymphadenopathy. Skin: No rashes, bruises or suspicious lesions. Neurologic: Grossly intact, no focal deficits, moving all 4 extremities. Psychiatric: Normal mood and affect.  Laboratory  Data: No results found for: WBC, HGB, HCT, MCV, PLT  Lab Results  Component Value Date   CREATININE 0.57 (L) 06/27/2015    No results found for: PSA  No results found for: TESTOSTERONE  No results found for: HGBA1C  Urinalysis    Component Value Date/Time   BILIRUBINUR neg 06/10/2013 0956   PROTEINUR neg 06/10/2013 0956   UROBILINOGEN negative 06/10/2013 0956   NITRITE neg 06/10/2013 0956   LEUKOCYTESUR Negative 06/10/2013 0956    No results found for: LABMICR, WBCUA, RBCUA, LABEPIT, MUCUS, BACTERIA  Pertinent Imaging: KUB today: Images reviewed and discussed with the patient No results found for this or any  previous visit.  No results found for this or any previous visit.  No results found for this or any previous visit.  No results found for this or any previous visit.  No results found for this or any previous visit.  No results found for this or any previous visit.  No results found for this or any previous visit.  No results found for this or any previous visit.   Assessment & Plan:    1. Nephrolithiasis -We discussed the management of kidney stones. These options include observation, ureteroscopy, shockwave lithotripsy (ESWL) and percutaneous nephrolithotomy (PCNL). We discussed which options are relevant to the patient's stone(s). We discussed the natural history of kidney stones as well as the complications of untreated stones and the impact on quality of life without treatment as well as with each of the above listed treatments. We also discussed the efficacy of each treatment in its ability to clear the stone burden. With any of these management options I discussed the signs and symptoms of infection and the need for emergent treatment should these be experienced. For each option we discussed the ability of each procedure to clear the patient of their stone burden.   For observation I described the risks which include but are not limited to silent renal damage, life-threatening infection, need for emergent surgery, failure to pass stone and pain.   For ureteroscopy I described the risks which include bleeding, infection, damage to contiguous structures, positioning injury, ureteral stricture, ureteral avulsion, ureteral injury, need for prolonged ureteral stent, inability to perform ureteroscopy, need for an interval procedure, inability to clear stone burden, stent discomfort/pain, heart attack, stroke, pulmonary embolus and the inherent risks with general anesthesia.   For shockwave lithotripsy I described the risks which include arrhythmia, kidney contusion, kidney hemorrhage, need  for transfusion, pain, inability to adequately break up stone, inability to pass stone fragments, Steinstrasse, infection associated with obstructing stones, need for alternate surgical procedure, need for repeat shockwave lithotripsy, MI, CVA, PE and the inherent risks with anesthesia/conscious sedation.   For PCNL I described the risks including positioning injury, pneumothorax, hydrothorax, need for chest tube, inability to clear stone burden, renal laceration, arterial venous fistula or malformation, need for embolization of kidney, loss of kidney or renal function, need for repeat procedure, need for prolonged nephrostomy tube, ureteral avulsion, MI, CVA, PE and the inherent risks of general anesthesia.   - The patient would like to proceed with right ESWL   No follow-ups on file.  Wilkie Aye, MD  [facility] [place]

## 2020-05-23 NOTE — Progress Notes (Signed)
Urological Symptom Review  Patient is experiencing the following symptoms: Hard to postpone urination Get up at night to urinate Leakage of urine Trouble starting stream Injury to kidneys/bladder   Review of Systems  Gastrointestinal (upper)  : Nausea Vomiting Indigestion/heartburn  Gastrointestinal (lower) : Negative for lower GI symptoms  Constitutional : Night Sweats Fatigue  Skin: Negative for skin symptoms  Eyes: Negative for eye symptoms  Ear/Nose/Throat : Negative for Ear/Nose/Throat symptoms  Hematologic/Lymphatic: Negative for Hematologic/Lymphatic symptoms  Cardiovascular : Negative for cardiovascular symptoms  Respiratory : Cough  Endocrine: Negative for endocrine symptoms  Musculoskeletal: Negative for musculoskeletal symptoms  Neurological: Negative for neurological symptoms  Psychologic: Negative for psychiatric symptoms

## 2020-05-23 NOTE — Patient Instructions (Signed)
[facility] (25th ed., pp. 811-816). [place], [place]: [employer], Else[employer]. Retrieved from https://www.clinicalkey.com/#!/content/book/[email or web address]-[email or web address]?scrollTo=%[email or web address]">  Lithotripsy  Lithotripsy is a treatment that can help break up kidney stones that are too large to pass on their own. This is a nonsurgical procedure that crushes a kidney stone with shock waves. These shock waves pass through your body and focus on the kidney stone. They cause the kidney stone to break up into smaller pieces while it is still in the urinary tract. The smaller pieces of stone can pass more easily out of your body in the urine. Tell a health care provider about:  Any allergies you have.  All medicines you are taking, including vitamins, herbs, eye drops, creams, and over-the-counter medicines.  Any problems you or family members have had with anesthetic medicines.  Any blood disorders you have.  Any surgeries you have had.  Any medical conditions you have.  Whether you are pregnant or may be pregnant. What are the risks? Generally, this is a safe procedure. However, problems may occur, including:  Infection.  Bleeding from the kidney.  Bruising of the kidney or skin.  Scarring of the kidney, which can lead to: ? Increased blood pressure. ? Poor kidney function. ? Return (recurrence) of kidney stones.  Damage to other structures or organs, such as the liver, colon, spleen, or pancreas.  Blockage (obstruction) of the tube that carries urine from the kidney to the bladder (ureter).  Failure of the kidney stone to break into pieces (fragments). What happens before the procedure? Staying hydrated Follow instructions from your health care provider about hydration, which may include:  Up to 2 hours before the procedure - you may continue to drink clear liquids, such as water, clear fruit juice, black coffee, and plain tea. Eating and drinking restrictions Follow  instructions from your health care provider about eating and drinking, which may include:  8 hours before the procedure - stop eating heavy meals or foods, such as meat, fried foods, or fatty foods.  6 hours before the procedure - stop eating light meals or foods, such as toast or cereal.  6 hours before the procedure - stop drinking milk or drinks that contain milk.  2 hours before the procedure - stop drinking clear liquids. Medicines Ask your health care provider about:  Changing or stopping your regular medicines. This is especially important if you are taking diabetes medicines or blood thinners.  Taking medicines such as aspirin and ibuprofen. These medicines can thin your blood. Do not take these medicines unless your health care provider tells you to take them.  Taking over-the-counter medicines, vitamins, herbs, and supplements. Tests You may have tests, such as:  Blood tests.  Urine tests.  Imaging tests, such as a CT scan. General instructions  Plan to have someone take you home from the hospital or clinic.  If you will be going home right after the procedure, plan to have someone with you for 24 hours.  Ask your health care provider what steps will be taken to help prevent infection. These may include washing skin with a germ-killing soap. What happens during the procedure?  An IV will be inserted into one of your veins.  You will be given one or more of the following: ? A medicine to help you relax (sedative). ? A medicine to make you fall asleep (general anesthetic).  A water-filled cushion may be placed behind your kidney or on your abdomen. In some cases, you may be placed in a tub of   lukewarm water.  Your body will be positioned in a way that makes it easy to target the kidney stone.  An X-ray or ultrasound exam will be done to locate your stone.  Shock waves will be aimed at the stone. If you are awake, you may feel a tapping sensation as the shock  waves pass through your body.  A flexible tube with holes in it (stent) may be placed in the ureter. This will help keep urine flowing from the kidney if the fragments of the stone have been blocking the ureter. The procedure may vary among health care providers and hospitals.   What happens after the procedure?  You may have an X-ray to see whether the procedure was able to break up the kidney stone and how much of the stone has passed. If large stone fragments remain after treatment, you may need to have a second procedure at a later time.  Your blood pressure, heart rate, breathing rate, and blood oxygen level will be monitored until you leave the hospital or clinic.  You may be given antibiotics or pain medicine as needed.  If a stent was placed in your ureter during surgery, it may stay in place for a few weeks.  You may need to strain your urine to collect pieces of the kidney stone for testing.  You will need to drink plenty of water.  If you were given a sedative during the procedure, it can affect you for several hours. Do not drive or operate machinery until your health care provider says that it is safe. Summary  Lithotripsy is a treatment that can help break up kidney stones that are too large to pass on their own.  Lithotripsy is a nonsurgical procedure that crushes a kidney stone with shock waves.  Generally, this is a safe procedure. However, problems may occur, including damage to the kidney or other organs, infection, or obstruction of the tube that carries urine from the kidney to the bladder (ureter).  You may have a stent placed in your ureter to help drain your urine. This stent may stay in place for a few weeks.  After the procedure, you will need to drink plenty of water. You may be asked to strain your urine to collect pieces of the kidney stone for testing. This information is not intended to replace advice given to you by your health care provider. Make sure  you discuss any questions you have with your health care provider. Document Revised: 01/07/2019 Document Reviewed: 01/07/2019 Elsevier Patient Education  2021 Elsevier Inc.  

## 2020-05-24 ENCOUNTER — Ambulatory Visit (HOSPITAL_COMMUNITY)
Admission: RE | Admit: 2020-05-24 | Discharge: 2020-05-24 | Disposition: A | Discharge status: Home / Self Care | Payer: 59 | Attending: Urology | Admitting: Urology

## 2020-05-24 ENCOUNTER — Encounter (HOSPITAL_COMMUNITY)
Admission: RE | Disposition: A | Discharge status: Home / Self Care | Payer: Self-pay | Source: Home / Self Care | Attending: Urology

## 2020-05-24 ENCOUNTER — Ambulatory Visit (HOSPITAL_COMMUNITY): Payer: 59

## 2020-05-24 DIAGNOSIS — Z79899 Other long term (current) drug therapy: Secondary | ICD-10-CM | POA: Diagnosis not present

## 2020-05-24 DIAGNOSIS — N200 Calculus of kidney: Secondary | ICD-10-CM

## 2020-05-24 DIAGNOSIS — N201 Calculus of ureter: Secondary | ICD-10-CM | POA: Diagnosis present

## 2020-05-24 HISTORY — PX: EXTRACORPOREAL SHOCK WAVE LITHOTRIPSY: SHX1557

## 2020-05-24 LAB — SARS CORONAVIRUS 2 (TAT 6-24 HRS): SARS Coronavirus 2: NEGATIVE

## 2020-05-24 SURGERY — LITHOTRIPSY, ESWL
Anesthesia: LOCAL | Laterality: Right

## 2020-05-24 MED ORDER — TAMSULOSIN HCL 0.4 MG PO CAPS: 0.4000 mg | ORAL_CAPSULE | Freq: Every day | ORAL | 0 refills | Status: DC

## 2020-05-24 MED ORDER — DIAZEPAM 5 MG PO TABS
10.0000 mg | ORAL_TABLET | Freq: Once | ORAL | Status: AC
  Administered 2020-05-24: 10 mg via ORAL
  Filled 2020-05-24: qty 2

## 2020-05-24 MED ORDER — OXYCODONE-ACETAMINOPHEN 5-325 MG PO TABS: 1.0000 | ORAL_TABLET | Freq: Four times a day (QID) | ORAL | 0 refills | Status: DC | PRN

## 2020-05-24 MED ORDER — DIPHENHYDRAMINE HCL 25 MG PO CAPS
25.0000 mg | ORAL_CAPSULE | ORAL | Status: AC
  Administered 2020-05-24: 25 mg via ORAL
  Filled 2020-05-24: qty 1

## 2020-05-24 MED ORDER — SODIUM CHLORIDE 0.9 % IV SOLN: Freq: Once | INTRAVENOUS | Status: AC

## 2020-05-24 MED ORDER — ONDANSETRON 4 MG PO TBDP: 4.0000 mg | ORAL_TABLET | Freq: Three times a day (TID) | ORAL | 0 refills | Status: DC | PRN

## 2020-05-24 NOTE — Progress Notes (Signed)
Pt has bruising to the right anterior groin area from litho. Area is slightly reddened. Pt has tenderness to area.

## 2020-05-24 NOTE — Discharge Instructions (Signed)

## 2020-05-26 ENCOUNTER — Encounter (HOSPITAL_COMMUNITY): Payer: Self-pay | Admitting: Urology

## 2020-06-07 NOTE — Interval H&P Note (Signed)
History and Physical Interval Note:  06/07/2020 11:11 AM  Alan Gill  has presented today for surgery, with the diagnosis of right ureteral calculus.  The various methods of treatment have been discussed with the patient and family. After consideration of risks, benefits and other options for treatment, the patient has consented to  Procedure(s): EXTRACORPOREAL SHOCK WAVE LITHOTRIPSY (ESWL) (Right) as a surgical intervention.  The patient's history has been reviewed, patient examined, no change in status, stable for surgery.  I have reviewed the patient's chart and labs.  Questions were answered to the patient's satisfaction.     Wilkie Aye

## 2020-06-15 ENCOUNTER — Other Ambulatory Visit: Payer: Self-pay

## 2020-06-15 ENCOUNTER — Encounter: Payer: Self-pay | Admitting: Urology

## 2020-06-15 ENCOUNTER — Ambulatory Visit (HOSPITAL_COMMUNITY)
Admission: RE | Admit: 2020-06-15 | Discharge: 2020-06-15 | Disposition: A | Discharge status: Home / Self Care | Payer: 59 | Source: Ambulatory Visit | Attending: Urology | Admitting: Urology

## 2020-06-15 ENCOUNTER — Ambulatory Visit (INDEPENDENT_AMBULATORY_CARE_PROVIDER_SITE_OTHER): Payer: 59 | Admitting: Urology

## 2020-06-15 VITALS — BP 131/72 | HR 71 | Temp 98.4°F | Ht 72.0 in | Wt 221.0 lb

## 2020-06-15 DIAGNOSIS — N138 Other obstructive and reflux uropathy: Secondary | ICD-10-CM | POA: Diagnosis not present

## 2020-06-15 DIAGNOSIS — N401 Enlarged prostate with lower urinary tract symptoms: Secondary | ICD-10-CM | POA: Diagnosis not present

## 2020-06-15 DIAGNOSIS — N200 Calculus of kidney: Secondary | ICD-10-CM | POA: Insufficient documentation

## 2020-06-15 DIAGNOSIS — R351 Nocturia: Secondary | ICD-10-CM | POA: Diagnosis not present

## 2020-06-15 MED ORDER — ALFUZOSIN HCL ER 10 MG PO TB24: 10.0000 mg | ORAL_TABLET | Freq: Every day | ORAL | 11 refills | Status: DC

## 2020-06-15 NOTE — Progress Notes (Unsigned)
Urological Symptom Review  Patient is experiencing the following symptoms: Frequent urination Kidney stones  Review of Systems  Gastrointestinal (upper)  : Negative for upper GI symptoms  Gastrointestinal (lower) : Negative for lower GI symptoms  Constitutional : Negative for symptoms  Skin: Negative for skin symptoms  Eyes: Negative for eye symptoms  Ear/Nose/Throat : Negative for Ear/Nose/Throat symptoms  Hematologic/Lymphatic: Negative for Hematologic/Lymphatic symptoms  Cardiovascular : Negative for cardiovascular symptoms  Respiratory : Negative for respiratory symptoms  Endocrine: Negative for endocrine symptoms  Musculoskeletal: Negative for musculoskeletal symptoms  Neurological: Negative for neurological symptoms  Psychologic: Negative for psychiatric symptoms 

## 2020-06-15 NOTE — Patient Instructions (Signed)

## 2020-06-15 NOTE — Progress Notes (Signed)
06/15/2020 2:00 PM   Alan Gill [birth year] [id number]  Referring provider: Dettinger, Elige Radon, MD [address] [place],  [place] [address]  followup nephrolithiasis  HPI: Alan Gill is a 59yo here for followup after ESWL. He passed multiple fragments and brought them with him today. He has urgency to urinate but no dysuria. He is currently on flomax 0.4mg  daily. KUB showed no definitive calculus. The notes worsening urinary urgency, frequency and nocturia over the past year. He was previously on flomax 0.4mg  daily which failed to improve his LUTS. He is unhappy with his urination.   PMH: Past Medical History:  Diagnosis Date  . Carpal tunnel syndrome on both sides   . GERD (gastroesophageal reflux disease)     Surgical History: Past Surgical History:  Procedure Laterality Date  . COLONOSCOPY N/A 02/20/2016   Procedure: COLONOSCOPY;  Surgeon: Corbin Ade, MD;  [facility];  Service: Endoscopy;  Laterality: N/A;  200  . EXTRACORPOREAL SHOCK WAVE LITHOTRIPSY Right 05/24/2020   Procedure: EXTRACORPOREAL SHOCK WAVE LITHOTRIPSY (ESWL);  Surgeon: Malen Gauze, MD;  Location: [facility];  Service: Urology;  Laterality: Right;  . WRIST SURGERY Bilateral    carpal tunnel    Home Medications:  Allergies as of 06/15/2020   No Known Allergies     Medication List       Accurate as of June 15, 2020  2:00 PM. If you have any questions, ask your nurse or doctor.        STOP taking these medications   ketorolac 10 MG tablet Commonly known as: TORADOL Stopped by: Alan Aye, MD     TAKE these medications   benzonatate 200 MG capsule Commonly known as: TESSALON Take 200 mg by mouth 2 (two) times daily.   Melatonin 10 MG Tabs Take 10 mg by mouth at bedtime as needed (sleep).   ondansetron 4 MG disintegrating tablet Commonly known as: ZOFRAN-ODT Take 1 tablet (4 mg total) by mouth every 8 (eight) hours as needed.   oxyCODONE-acetaminophen 5-325 MG  tablet Commonly known as: PERCOCET/ROXICET Take 1 tablet by mouth 4 (four) times daily as needed.   tadalafil 5 MG tablet Commonly known as: CIALIS Take 5 mg by mouth daily.   tamsulosin 0.4 MG Caps capsule Commonly known as: FLOMAX Take 1 capsule (0.4 mg total) by mouth daily.       Allergies: No Known Allergies  Family History: Family History  Problem Relation Age of Onset  . Healthy Mother   . Liver cancer Mother   . Healthy Father   . Bladder Cancer Father   . Healthy Sister   . Cancer Maternal Grandmother        breast  . Early death Sister   . Drug abuse Son   . Bladder Cancer Paternal Uncle   . Colon cancer Neg Hx     Social History:  reports that he has never smoked. He has never used smokeless tobacco. He reports that he does not drink alcohol and does not use drugs.  ROS: All other review of systems were reviewed and are negative except what is noted above in HPI  Physical Exam: BP 131/72   Pulse 71   Temp 98.4 F (36.9 C)   Ht 6' (1.829 m)   Wt 221 lb (100.2 kg)   BMI 29.97 kg/m   Constitutional:  Alert and oriented, No acute distress. HEENT: [place] AT, moist mucus membranes.  Trachea midline, no masses. Cardiovascular: No clubbing,  cyanosis, or edema. Respiratory: Normal respiratory effort, no increased work of breathing. GI: Abdomen is soft, nontender, nondistended, no abdominal masses GU: No CVA tenderness.  Lymph: No cervical or inguinal lymphadenopathy. Skin: No rashes, bruises or suspicious lesions. Neurologic: Grossly intact, no focal deficits, moving all 4 extremities. Psychiatric: Normal mood and affect.  Laboratory Data: No results found for: WBC, HGB, HCT, MCV, PLT  Lab Results  Component Value Date   CREATININE 0.57 (L) 06/27/2015    No results found for: PSA  No results found for: TESTOSTERONE  No results found for: HGBA1C  Urinalysis    Component Value Date/Time   APPEARANCEUR Clear 05/23/2020 1354   GLUCOSEU Negative  05/23/2020 1354   BILIRUBINUR Negative 05/23/2020 1354   PROTEINUR 1+ (A) 05/23/2020 1354   UROBILINOGEN negative 06/10/2013 0956   NITRITE Negative 05/23/2020 1354   LEUKOCYTESUR Trace (A) 05/23/2020 1354    Lab Results  Component Value Date   LABMICR See below: 05/23/2020   WBCUA 0-5 05/23/2020   LABEPIT 0-10 05/23/2020   MUCUS Present 05/23/2020   BACTERIA Few 05/23/2020    Pertinent Imaging: KUB today: Images reviewed and discussed with the patient Results for orders placed during the hospital encounter of 05/24/20  DG Abd 1 View  Narrative CLINICAL DATA:  Ureteral calculi assessment  EXAM: ABDOMEN - 1 VIEW  COMPARISON:  Abdominal radiograph May 23, 2020  FINDINGS: The bowel gas pattern is normal. No definite radio-opaque renal calculi. Pelvic phleboliths.  IMPRESSION: No definite radio-opaque renal calculi. Probable pelvic phleboliths.   Electronically Signed By: Maudry Mayhew MD On: 05/24/2020 09:32  No results found for this or any previous visit.  No results found for this or any previous visit.  No results found for this or any previous visit.  No results found for this or any previous visit.  No results found for this or any previous visit.  No results found for this or any previous visit.  No results found for this or any previous visit.   Assessment & Plan:    1. Kidney stones RTC 1 month with KUB - Urinalysis, Routine w reflex microscopic  2. BPH with nocturia -Uroxatral 10mg  qhs   No follow-ups on file.  , MD  [facility] [place]

## 2020-06-16 LAB — URINALYSIS, ROUTINE W REFLEX MICROSCOPIC
Bilirubin, UA: NEGATIVE
Glucose, UA: NEGATIVE
Leukocytes,UA: NEGATIVE
Nitrite, UA: NEGATIVE
Protein,UA: NEGATIVE
RBC, UA: NEGATIVE
Specific Gravity, UA: 1.025 (ref 1.005–1.030)
Urobilinogen, Ur: 0.2 mg/dL (ref 0.2–1.0)
pH, UA: 5.5 (ref 5.0–7.5)

## 2020-06-28 ENCOUNTER — Other Ambulatory Visit: Payer: Self-pay

## 2020-06-28 ENCOUNTER — Telehealth: Payer: Self-pay | Admitting: Urology

## 2020-06-28 DIAGNOSIS — N200 Calculus of kidney: Secondary | ICD-10-CM

## 2020-06-28 NOTE — Telephone Encounter (Signed)
Pt called this morning and said that he is having a burning sensation when he goes to the bathroom. Also only having small amounts of urine when he uses the bathroom. He didn't know if he could drop off a urine. Please call when you can.

## 2020-06-28 NOTE — Telephone Encounter (Signed)
Patient c/o voiding small amounts with dysuria.  Pt recently had litho. Discussed with patient his symptoms could be related to fragments of stones trying to pass. Patient requested lab visit for urine drop off. Lab visit scheduled. Encouraged patient to increased fluids as well. Patient voiced understanding.

## 2020-06-29 ENCOUNTER — Other Ambulatory Visit: Payer: 59

## 2020-06-29 ENCOUNTER — Other Ambulatory Visit: Payer: Self-pay

## 2020-06-29 LAB — URINALYSIS, ROUTINE W REFLEX MICROSCOPIC
Bilirubin, UA: NEGATIVE
Glucose, UA: NEGATIVE
Ketones, UA: NEGATIVE
Nitrite, UA: NEGATIVE
Protein,UA: NEGATIVE
RBC, UA: NEGATIVE
Specific Gravity, UA: 1.015 (ref 1.005–1.030)
Urobilinogen, Ur: 0.2 mg/dL (ref 0.2–1.0)
pH, UA: 6 (ref 5.0–7.5)

## 2020-06-29 LAB — MICROSCOPIC EXAMINATION
RBC, Urine: NONE SEEN /hpf (ref 0–2)
Renal Epithel, UA: NONE SEEN /hpf

## 2020-07-01 LAB — URINE CULTURE

## 2020-07-05 NOTE — Progress Notes (Signed)
Results sent via my chart 

## 2020-07-14 ENCOUNTER — Ambulatory Visit (HOSPITAL_COMMUNITY)
Admission: RE | Admit: 2020-07-14 | Discharge: 2020-07-14 | Disposition: A | Discharge status: Home / Self Care | Payer: 59 | Source: Ambulatory Visit | Attending: Urology | Admitting: Urology

## 2020-07-14 DIAGNOSIS — N200 Calculus of kidney: Secondary | ICD-10-CM | POA: Diagnosis present

## 2020-07-15 ENCOUNTER — Other Ambulatory Visit: Payer: Self-pay

## 2020-07-15 ENCOUNTER — Ambulatory Visit (INDEPENDENT_AMBULATORY_CARE_PROVIDER_SITE_OTHER): Payer: 59 | Admitting: Urology

## 2020-07-15 ENCOUNTER — Encounter: Payer: Self-pay | Admitting: Urology

## 2020-07-15 DIAGNOSIS — N200 Calculus of kidney: Secondary | ICD-10-CM | POA: Diagnosis not present

## 2020-07-15 DIAGNOSIS — R351 Nocturia: Secondary | ICD-10-CM

## 2020-07-15 DIAGNOSIS — N138 Other obstructive and reflux uropathy: Secondary | ICD-10-CM

## 2020-07-15 DIAGNOSIS — N401 Enlarged prostate with lower urinary tract symptoms: Secondary | ICD-10-CM

## 2020-07-15 LAB — MICROSCOPIC EXAMINATION
Bacteria, UA: NONE SEEN
Epithelial Cells (non renal): NONE SEEN /hpf (ref 0–10)
RBC, Urine: NONE SEEN /hpf (ref 0–2)
Renal Epithel, UA: NONE SEEN /hpf
WBC, UA: NONE SEEN /hpf (ref 0–5)

## 2020-07-15 LAB — URINALYSIS, ROUTINE W REFLEX MICROSCOPIC
Bilirubin, UA: NEGATIVE
Glucose, UA: NEGATIVE
Ketones, UA: NEGATIVE
Leukocytes,UA: NEGATIVE
Nitrite, UA: NEGATIVE
Protein,UA: NEGATIVE
Specific Gravity, UA: >1.03 — ABNORMAL HIGH (ref 1.005–1.030)
Urobilinogen, Ur: 0.2 mg/dL (ref 0.2–1.0)
pH, UA: 5.5 (ref 5.0–7.5)

## 2020-07-15 MED ORDER — ALFUZOSIN HCL ER 10 MG PO TB24: 10.0000 mg | ORAL_TABLET | Freq: Every day | ORAL | 11 refills | Status: DC

## 2020-07-15 MED ORDER — MIRABEGRON ER 25 MG PO TB24: 25.0000 mg | ORAL_TABLET | Freq: Every day | ORAL | 0 refills | Status: AC

## 2020-07-15 NOTE — Progress Notes (Signed)
07/15/2020 8:56 AM   Alan Gill 23-Dec-1960 [id number]  Referring provider: Toma Deiters, MD [address] [place],  [place] [address]  Chief Complaint  Patient presents with  . Follow-up    HPI: Alan Gill is a 60yo here for followup for BPH, nocturia and nephrolithiasis. He passed a calculus on 3/24 and no pain since then. Uroxatral 10mg  improved his LUTS. IPSS 19 QOL5. He has nocturia 3-5x. He has urinary urgency and frequency.    PMH: Past Medical History:  Diagnosis Date  . Carpal tunnel syndrome on both sides   . GERD (gastroesophageal reflux disease)     Surgical History: Past Surgical History:  Procedure Laterality Date  . COLONOSCOPY N/A 02/20/2016   Procedure: COLONOSCOPY;  Surgeon: 02/22/2016, MD;  [facility];  Service: Endoscopy;  Laterality: N/A;  200  . EXTRACORPOREAL SHOCK WAVE LITHOTRIPSY Right 05/24/2020   Procedure: EXTRACORPOREAL SHOCK WAVE LITHOTRIPSY (ESWL);  Surgeon: 05/26/2020, MD;  Location: [facility];  Service: Urology;  Laterality: Right;  . WRIST SURGERY Bilateral    carpal tunnel    Home Medications:  Allergies as of 07/15/2020   No Known Allergies     Medication List       Accurate as of July 15, 2020  8:56 AM. If you have any questions, ask your nurse or doctor.        STOP taking these medications   benzonatate 200 MG capsule Commonly known as: TESSALON Stopped by: July 17, 2020, MD     TAKE these medications   alfuzosin 10 MG 24 hr tablet Commonly known as: UROXATRAL Take 1 tablet (10 mg total) by mouth daily with breakfast.   Melatonin 10 MG Tabs Take 10 mg by mouth at bedtime as needed (sleep).   ondansetron 4 MG disintegrating tablet Commonly known as: ZOFRAN-ODT Take 1 tablet (4 mg total) by mouth every 8 (eight) hours as needed.   oxyCODONE-acetaminophen 5-325 MG tablet Commonly known as: PERCOCET/ROXICET Take 1 tablet by mouth 4 (four) times daily as needed.   tadalafil 5 MG  tablet Commonly known as: CIALIS Take 5 mg by mouth daily.   tamsulosin 0.4 MG Caps capsule Commonly known as: FLOMAX Take 1 capsule (0.4 mg total) by mouth daily.       Allergies: No Known Allergies  Family History: Family History  Problem Relation Age of Onset  . Healthy Mother   . Liver cancer Mother   . Healthy Father   . Bladder Cancer Father   . Healthy Sister   . Cancer Maternal Grandmother        breast  . Early death Sister   . Drug abuse Son   . Bladder Cancer Paternal Uncle   . Colon cancer Neg Hx     Social History:  reports that he has never smoked. He has never used smokeless tobacco. He reports that he does not drink alcohol and does not use drugs.  ROS: All other review of systems were reviewed and are negative except what is noted above in HPI  Physical Exam: There were no vitals taken for this visit.  Constitutional:  Alert and oriented, No acute distress. HEENT: Alan Gill AT, moist mucus membranes.  Trachea midline, no masses. Cardiovascular: No clubbing, cyanosis, or edema. Respiratory: Normal respiratory effort, no increased work of breathing. GI: Abdomen is soft, nontender, nondistended, no abdominal masses GU: No CVA tenderness.  Lymph: No cervical or inguinal lymphadenopathy. Skin: No rashes, bruises or suspicious lesions.  Neurologic: Grossly intact, no focal deficits, moving all 4 extremities. Psychiatric: Normal mood and affect.  Laboratory Data: No results found for: WBC, HGB, HCT, MCV, PLT  Lab Results  Component Value Date   CREATININE 0.57 (L) 06/27/2015    No results found for: PSA  No results found for: TESTOSTERONE  No results found for: HGBA1C  Urinalysis    Component Value Date/Time   APPEARANCEUR Clear 06/29/2020 1035   GLUCOSEU Negative 06/29/2020 1035   BILIRUBINUR Negative 06/29/2020 1035   PROTEINUR Negative 06/29/2020 1035   UROBILINOGEN negative 06/10/2013 0956   NITRITE Negative 06/29/2020 1035   LEUKOCYTESUR  Trace (A) 06/29/2020 1035    Lab Results  Component Value Date   LABMICR See below: 06/29/2020   WBCUA 6-10 (A) 06/29/2020   LABEPIT 0-10 06/29/2020   MUCUS Present 05/23/2020   BACTERIA Few 06/29/2020    Pertinent Imaging: KUb yesterday: Images reviewed and discussed with the patient Results for orders placed during the hospital encounter of 06/15/20  DG Abd 1 View  Narrative CLINICAL DATA:  Post ESWL.  EXAM: ABDOMEN - 1 VIEW  COMPARISON:  05/24/2020.  FINDINGS: Soft tissue structures are unremarkable. Punctate 2 mm calcific density over the left upper flank cannot be excluded. Tiny proximal left ureteral stone cannot be excluded. Stable pelvic calcifications most consistent with phleboliths. Distal ureteral stones cannot be entirely excluded. No bowel distention. Degenerative change lumbar spine and both hips.  IMPRESSION: Punctate 2 mm calcific density over the left upper flank cannot be excluded. Tiny proximal left ureteral stone cannot be excluded. Exam otherwise unremarkable.   Electronically Signed By: Alan Fus  Register On: 06/16/2020 07:26  No results found for this or any previous visit.  No results found for this or any previous visit.  No results found for this or any previous visit.  No results found for this or any previous visit.  No results found for this or any previous visit.  No results found for this or any previous visit.  No results found for this or any previous visit.   Assessment & Plan:    1. Kidney stones RTC 6 months with KUB  2. Benign prostatic hyperplasia with urinary obstruction -continue uroxatral 10mg   3. Nocturia -we will try mirabegron 25mg    Return in about 6 months (around 01/14/2021).  , MD  [facility] [place]

## 2020-07-15 NOTE — Progress Notes (Signed)
Urological Symptom Review  Patient is experiencing the following symptoms: Frequent urination Leakage of urine   Review of Systems  Gastrointestinal (upper)  : Negative for upper GI symptoms  Gastrointestinal (lower) : Negative for lower GI symptoms  Constitutional : Night Sweats  Skin: Negative for skin symptoms  Eyes: Negative for eye symptoms  Ear/Nose/Throat : Negative for Ear/Nose/Throat symptoms  Hematologic/Lymphatic: Negative for Hematologic/Lymphatic symptoms  Cardiovascular : Negative for cardiovascular symptoms  Respiratory : Negative for respiratory symptoms  Endocrine: Negative for endocrine symptoms  Musculoskeletal: Negative for musculoskeletal symptoms  Neurological: Negative for neurological symptoms  Psychologic: Negative for psychiatric symptoms

## 2020-07-15 NOTE — Patient Instructions (Signed)

## 2020-08-02 ENCOUNTER — Other Ambulatory Visit: Payer: Self-pay

## 2020-08-02 MED ORDER — MIRABEGRON ER 25 MG PO TB24: 25.0000 mg | ORAL_TABLET | Freq: Every day | ORAL | 11 refills | Status: AC

## 2020-08-04 ENCOUNTER — Telehealth: Payer: Self-pay

## 2020-08-04 NOTE — Telephone Encounter (Signed)
Attempted PA on cover my meds for myrbetriq. Per Cover my med- plan will cover Gemtesa.   Message sent to MD

## 2020-08-08 NOTE — Telephone Encounter (Signed)
Spoke with pt on 4/29 and patient stated he would come by for samples.

## 2021-01-16 ENCOUNTER — Ambulatory Visit: Payer: 59 | Admitting: Urology

## 2021-02-28 ENCOUNTER — Other Ambulatory Visit: Payer: Self-pay

## 2021-02-28 ENCOUNTER — Ambulatory Visit (HOSPITAL_COMMUNITY)
Admission: RE | Admit: 2021-02-28 | Discharge: 2021-02-28 | Disposition: A | Discharge status: Home / Self Care | Payer: BC Managed Care – PPO | Source: Ambulatory Visit | Attending: Urology | Admitting: Urology

## 2021-02-28 DIAGNOSIS — N200 Calculus of kidney: Secondary | ICD-10-CM | POA: Insufficient documentation

## 2021-02-28 DIAGNOSIS — Z87442 Personal history of urinary calculi: Secondary | ICD-10-CM | POA: Diagnosis not present

## 2021-03-01 ENCOUNTER — Ambulatory Visit: Payer: 59 | Admitting: Urology

## 2021-03-01 ENCOUNTER — Encounter: Payer: Self-pay | Admitting: Urology

## 2021-03-01 VITALS — BP 143/78 | HR 106

## 2021-03-01 DIAGNOSIS — N138 Other obstructive and reflux uropathy: Secondary | ICD-10-CM

## 2021-03-01 DIAGNOSIS — R351 Nocturia: Secondary | ICD-10-CM

## 2021-03-01 DIAGNOSIS — N401 Enlarged prostate with lower urinary tract symptoms: Secondary | ICD-10-CM | POA: Diagnosis not present

## 2021-03-01 DIAGNOSIS — N200 Calculus of kidney: Secondary | ICD-10-CM

## 2021-03-01 LAB — URINALYSIS, ROUTINE W REFLEX MICROSCOPIC
Bilirubin, UA: NEGATIVE
Glucose, UA: NEGATIVE
Ketones, UA: NEGATIVE
Leukocytes,UA: NEGATIVE
Nitrite, UA: NEGATIVE
Protein,UA: NEGATIVE
RBC, UA: NEGATIVE
Specific Gravity, UA: 1.01 (ref 1.005–1.030)
Urobilinogen, Ur: 0.2 mg/dL (ref 0.2–1.0)
pH, UA: 5.5 (ref 5.0–7.5)

## 2021-03-01 MED ORDER — HYDROXYZINE HCL 10 MG PO TABS: 10.0000 mg | ORAL_TABLET | Freq: Every day | ORAL | 3 refills | Status: DC

## 2021-03-01 MED ORDER — SILODOSIN 8 MG PO CAPS: 8.0000 mg | ORAL_CAPSULE | Freq: Every day | ORAL | 11 refills | Status: DC

## 2021-03-01 NOTE — Addendum Note (Signed)
Addended by: Malen Gauze on: 03/01/2021 10:48 AM   Modules accepted: Orders

## 2021-03-01 NOTE — Progress Notes (Signed)

## 2021-03-01 NOTE — Progress Notes (Signed)
03/01/2021 10:39 AM   Alan Gill [birth year] [id number]  Referring provider: Toma Deiters, MD [address] [place],  [place] [address]  Followup nephrolithiasis and BPH   HPI: Mr Alan Gill is a 60yo here for followup for BPH and nephrolithiasis. No stone events since last visit. KUB from yesterday showed no calculi. No flank pain,. IPSS 14 QOL 4. Nocturia 4-5x on uroxatral 10mg . The voids are small volumes. He previously took flomax which failed to improve his LUTS. He is unhappy with his urination.    PMH: Past Medical History:  Diagnosis Date   Carpal tunnel syndrome on both sides    GERD (gastroesophageal reflux disease)     Surgical History: Past Surgical History:  Procedure Laterality Date   COLONOSCOPY N/A 02/20/2016   Procedure: COLONOSCOPY;  Surgeon: 02/22/2016, MD;  [facility];  Service: Endoscopy;  Laterality: N/A;  200   EXTRACORPOREAL SHOCK WAVE LITHOTRIPSY Right 05/24/2020   Procedure: EXTRACORPOREAL SHOCK WAVE LITHOTRIPSY (ESWL);  Surgeon: 05/26/2020, MD;  Location: [facility];  Service: Urology;  Laterality: Right;   WRIST SURGERY Bilateral    carpal tunnel    Home Medications:  Allergies as of 03/01/2021   No Known Allergies      Medication List        Accurate as of March 01, 2021 10:39 AM. If you have any questions, ask your nurse or doctor.          STOP taking these medications    Melatonin 10 MG Tabs Stopped by: March 03, 2021, MD   ondansetron 4 MG disintegrating tablet Commonly known as: ZOFRAN-ODT Stopped by: Alan Aye, MD   oxyCODONE-acetaminophen 5-325 MG tablet Commonly known as: PERCOCET/ROXICET Stopped by: Alan Aye, MD   tamsulosin 0.4 MG Caps capsule Commonly known as: FLOMAX Stopped by: Alan Aye, MD       TAKE these medications    alfuzosin 10 MG 24 hr tablet Commonly known as: UROXATRAL Take 1 tablet (10 mg total) by mouth daily with breakfast.   mirabegron ER  25 MG Tb24 tablet Commonly known as: MYRBETRIQ Take 1 tablet (25 mg total) by mouth daily.   mirabegron ER 25 MG Tb24 tablet Commonly known as: MYRBETRIQ Take 1 tablet (25 mg total) by mouth daily.   tadalafil 5 MG tablet Commonly known as: CIALIS Take 5 mg by mouth daily.        Allergies: No Known Allergies  Family History: Family History  Problem Relation Age of Onset   Healthy Mother    Liver cancer Mother    Healthy Father    Bladder Cancer Father    Healthy Sister    Cancer Maternal Grandmother        breast   Early death Sister    Drug abuse Son    Bladder Cancer Paternal Uncle    Colon cancer Neg Hx     Social History:  reports that he has never smoked. He has never used smokeless tobacco. He reports that he does not drink alcohol and does not use drugs.  ROS: All other review of systems were reviewed and are negative except what is noted above in HPI  Physical Exam: BP (!) 143/78   Pulse (!) 106   Constitutional:  Alert and oriented, No acute distress. HEENT: [place] AT, moist mucus membranes.  Trachea midline, no masses. Cardiovascular: No clubbing, cyanosis, or edema. Respiratory: Normal respiratory effort, no increased work of breathing. GI: Abdomen is soft, nontender, nondistended,  no abdominal masses GU: No CVA tenderness.  Lymph: No cervical or inguinal lymphadenopathy. Skin: No rashes, bruises or suspicious lesions. Neurologic: Grossly intact, no focal deficits, moving all 4 extremities. Psychiatric: Normal mood and affect.  Laboratory Data: No results found for: WBC, HGB, HCT, MCV, PLT  Lab Results  Component Value Date   CREATININE 0.57 (L) 06/27/2015    No results found for: PSA  No results found for: TESTOSTERONE  No results found for: HGBA1C  Urinalysis    Component Value Date/Time   APPEARANCEUR Clear 07/15/2020 0910   GLUCOSEU Negative 07/15/2020 0910   BILIRUBINUR Negative 07/15/2020 0910   PROTEINUR Negative 07/15/2020 0910    UROBILINOGEN negative 06/10/2013 0956   NITRITE Negative 07/15/2020 0910   LEUKOCYTESUR Negative 07/15/2020 0910    Lab Results  Component Value Date   LABMICR See below: 07/15/2020   WBCUA None seen 07/15/2020   LABEPIT None seen 07/15/2020   MUCUS Present 05/23/2020   BACTERIA None seen 07/15/2020    Pertinent Imaging:  Results for orders placed during the hospital encounter of 02/28/21  Abdomen 1 view (KUB)  Narrative CLINICAL DATA:  Kidney stone.  EXAM: ABDOMEN - 1 VIEW  COMPARISON:  Abdominal radiograph dated 07/14/2020.  FINDINGS: No bowel dilatation or evidence of obstruction. No free air or radiopaque calculi. Degenerative changes of the spine. No acute osseous pathology. The soft tissues are unremarkable.  IMPRESSION: Negative.   Electronically Signed By: Alan Gill M.D. On: 02/28/2021 21:10  No results found for this or any previous visit.  No results found for this or any previous visit.  No results found for this or any previous visit.  No results found for this or any previous visit.  No results found for this or any previous visit.  No results found for this or any previous visit.  No results found for this or any previous visit.   Assessment & Plan:    1. Kidney stones -RTC 6 months with KUB - Urinalysis, Routine w reflex microscopic  2. Benign prostatic hyperplasia with urinary obstruction -we will trial rapaflo 8mg   - Urinalysis, Routine w reflex microscopic  3. Nocturia -Rapaflo8mg  qhs   No follow-ups on file.  , MD  [facility] [place]

## 2021-03-01 NOTE — Patient Instructions (Signed)

## 2021-03-14 DIAGNOSIS — I10 Essential (primary) hypertension: Secondary | ICD-10-CM | POA: Diagnosis not present

## 2021-05-31 ENCOUNTER — Other Ambulatory Visit: Payer: Self-pay | Admitting: Urology

## 2021-05-31 ENCOUNTER — Ambulatory Visit (INDEPENDENT_AMBULATORY_CARE_PROVIDER_SITE_OTHER): Payer: No Typology Code available for payment source | Admitting: Urology

## 2021-05-31 ENCOUNTER — Other Ambulatory Visit: Payer: Self-pay

## 2021-05-31 VITALS — BP 137/74 | HR 82 | Ht 72.0 in | Wt 235.0 lb

## 2021-05-31 DIAGNOSIS — N35011 Post-traumatic bulbous urethral stricture: Secondary | ICD-10-CM | POA: Diagnosis not present

## 2021-05-31 DIAGNOSIS — R351 Nocturia: Secondary | ICD-10-CM

## 2021-05-31 DIAGNOSIS — N138 Other obstructive and reflux uropathy: Secondary | ICD-10-CM | POA: Diagnosis not present

## 2021-05-31 DIAGNOSIS — N35819 Other urethral stricture, male, unspecified site: Secondary | ICD-10-CM

## 2021-05-31 DIAGNOSIS — N401 Enlarged prostate with lower urinary tract symptoms: Secondary | ICD-10-CM | POA: Diagnosis not present

## 2021-05-31 LAB — URINALYSIS, ROUTINE W REFLEX MICROSCOPIC
Bilirubin, UA: NEGATIVE
Glucose, UA: NEGATIVE
Ketones, UA: NEGATIVE
Leukocytes,UA: NEGATIVE
Nitrite, UA: NEGATIVE
Protein,UA: NEGATIVE
RBC, UA: NEGATIVE
Specific Gravity, UA: 1.02 (ref 1.005–1.030)
Urobilinogen, Ur: 0.2 mg/dL (ref 0.2–1.0)
pH, UA: 5.5 (ref 5.0–7.5)

## 2021-05-31 LAB — BLADDER SCAN AMB NON-IMAGING: Scan Result: 119

## 2021-05-31 MED ORDER — CIPROFLOXACIN HCL 500 MG PO TABS
500.0000 mg | ORAL_TABLET | Freq: Once | ORAL | Status: AC
  Administered 2021-05-31: 500 mg via ORAL

## 2021-05-31 NOTE — Progress Notes (Signed)
05/31/2021 9:30 AM   Alan Gill [birth year] [id number]  Referring provider: Toma Deiters, MD [address] [place],  [place] [address]  Followup BPh and nocturia   HPI: Alan Gill is a 60yo here for followup for BPH and nocturia. IPSS 15 QOL 4. Nocturia 3x. Urine stream weak on rapaflo 8mg . He has straining to urinate. He is unhappy with his urination.   PMH: Past Medical History:  Diagnosis Date   Carpal tunnel syndrome on both sides    GERD (gastroesophageal reflux disease)     Surgical History: Past Surgical History:  Procedure Laterality Date   COLONOSCOPY N/A 02/20/2016   Procedure: COLONOSCOPY;  Surgeon: 02/22/2016, MD;  [facility];  Service: Endoscopy;  Laterality: N/A;  200   EXTRACORPOREAL SHOCK WAVE LITHOTRIPSY Right 05/24/2020   Procedure: EXTRACORPOREAL SHOCK WAVE LITHOTRIPSY (ESWL);  Surgeon: 05/26/2020, MD;  Location: [facility];  Service: Urology;  Laterality: Right;   WRIST SURGERY Bilateral    carpal tunnel    Home Medications:  Allergies as of 05/31/2021   No Known Allergies      Medication List        Accurate as of May 31, 2021  9:30 AM. If you have any questions, ask your nurse or doctor.          alfuzosin 10 MG 24 hr tablet Commonly known as: UROXATRAL Take 1 tablet (10 mg total) by mouth daily with breakfast.   hydrOXYzine 10 MG tablet Commonly known as: ATARAX Take 1 tablet (10 mg total) by mouth at bedtime.   mirabegron ER 25 MG Tb24 tablet Commonly known as: MYRBETRIQ Take 1 tablet (25 mg total) by mouth daily.   mirabegron ER 25 MG Tb24 tablet Commonly known as: MYRBETRIQ Take 1 tablet (25 mg total) by mouth daily.   silodosin 8 MG Caps capsule Commonly known as: RAPAFLO Take 1 capsule (8 mg total) by mouth at bedtime.   tadalafil 5 MG tablet Commonly known as: CIALIS Take 5 mg by mouth daily.        Allergies: No Known Allergies  Family History: Family History  Problem Relation Age  of Onset   Healthy Mother    Liver cancer Mother    Healthy Father    Bladder Cancer Father    Healthy Sister    Cancer Maternal Grandmother        breast   Early death Sister    Drug abuse Son    Bladder Cancer Paternal Uncle    Colon cancer Neg Hx     Social History:  reports that he has never smoked. He has never used smokeless tobacco. He reports that he does not drink alcohol and does not use drugs.  ROS: All other review of systems were reviewed and are negative except what is noted above in HPI  Physical Exam: BP 137/74    Pulse 82    Ht 6' (1.829 m)    Wt 235 lb (106.6 kg)    BMI 31.87 kg/m   Constitutional:  Alert and oriented, No acute distress. HEENT: [place] AT, moist mucus membranes.  Trachea midline, no masses. Cardiovascular: No clubbing, cyanosis, or edema. Respiratory: Normal respiratory effort, no increased work of breathing. GI: Abdomen is soft, nontender, nondistended, no abdominal masses GU: No CVA tenderness.  Lymph: No cervical or inguinal lymphadenopathy. Skin: No rashes, bruises or suspicious lesions. Neurologic: Grossly intact, no focal deficits, moving all 4 extremities. Psychiatric: Normal mood and affect.  Laboratory Data: No results found for: WBC, HGB, HCT, MCV, PLT  Lab Results  Component Value Date   CREATININE 0.57 (L) 06/27/2015    No results found for: PSA  No results found for: TESTOSTERONE  No results found for: HGBA1C  Urinalysis    Component Value Date/Time   APPEARANCEUR Clear 03/01/2021 0947   GLUCOSEU Negative 03/01/2021 0947   BILIRUBINUR Negative 03/01/2021 0947   PROTEINUR Negative 03/01/2021 0947   UROBILINOGEN negative 06/10/2013 0956   NITRITE Negative 03/01/2021 0947   LEUKOCYTESUR Negative 03/01/2021 0947    Lab Results  Component Value Date   LABMICR Comment 03/01/2021   WBCUA None seen 07/15/2020   LABEPIT None seen 07/15/2020   MUCUS Present 05/23/2020   BACTERIA None seen 07/15/2020    Pertinent  Imaging:  Results for orders placed during the hospital encounter of 02/28/21  Abdomen 1 view (KUB)  Narrative CLINICAL DATA:  Kidney stone.  EXAM: ABDOMEN - 1 VIEW  COMPARISON:  Abdominal radiograph dated 07/14/2020.  FINDINGS: No bowel dilatation or evidence of obstruction. No free air or radiopaque calculi. Degenerative changes of the spine. No acute osseous pathology. The soft tissues are unremarkable.  IMPRESSION: Negative.   Electronically Signed By: Elgie Collard M.D. On: 02/28/2021 21:10  No results found for this or any previous visit.  No results found for this or any previous visit.  No results found for this or any previous visit.  No results found for this or any previous visit.  No results found for this or any previous visit.  No results found for this or any previous visit.  No results found for this or any previous visit.   Assessment & Plan:    1. Benign prostatic hyperplasia with urinary obstruction -Continue rapaflo 8mg  qhs - Urinalysis, Routine w reflex microscopic - BLADDER SCAN AMB NON-IMAGING  2. Nocturia -Continue rapaflo 8mg  qhs  3. Bulbar urethral stricture -I discussed the management of urethral stricture includign observation, balloon dilation, DVIU and urethroplasty. After discussing the options the patient elects for cystoscopy with balloon dilation. Risks/benefits/alternatives discussed   No follow-ups on file.  , MD  [facility] [place]    05/31/21  CC: No chief complaint on file.   HPI:  Blood pressure 137/74, pulse 82, height 6' (1.829 m), weight 235 lb (106.6 kg). NED. A&Ox3.   No respiratory distress   Abd soft, NT, ND Normal phallus with bilateral descended testicles  Cystoscopy Procedure Note  Patient identification was confirmed, informed consent was obtained, and patient was prepped using Betadine solution.  Lidocaine jelly was administered per urethral meatus.      Pre-Procedure: - Inspection reveals a normal caliber ureteral meatus.  Procedure: The flexible cystoscope was introduced without difficulty - Bulbar urethral stricture present which was 8 french. Cystoscope was passed gently across the stricture - Enlarged prostate no median lobe - Normal bladder neck - Bilateral ureteral orifices identified - Bladder mucosa  reveals no ulcers, tumors, or lesions - No bladder stones - No trabeculation  Retroflexion shows no intravesical prostatic protrusion   Post-Procedure: - Patient tolerated the procedure well  Assessment/ Plan: We will schedule for urethral dilation  06/02/21, MD

## 2021-05-31 NOTE — Progress Notes (Signed)
Surgical Physician Order [facility] [place]  * Scheduling expectation :  March 16th, 2023  *Length of Case: 30 minutes  *MD Preforming Case: Nicolette Bang, MD  *Assistant Needed: no  *Facility Preference: [facility]  *Clearance needed: no  *Anticoagulation Instructions: Hold all anticoagulants  *Aspirin Instructions: Ok to continue Aspirin  -Admit type: OUTpatient  -Anesthesia: General  -Use Standing Orders:  NA  *Diagnosis: Urethral Stricture  *Procedure:  cystoscopy  Cysto w/urethral dilation [id number])  Additional orders: N/A  -Equipment:  NA -VTE Prophylaxis Standing Order SCDs       Other:   -Standing Lab Orders Per Anesthesia    Lab other: None  -Standing Test orders EKG/Chest x-ray per Anesthesia       Test other:   - Medications:  Ancef 2gm IV  -Other orders:  N/A  *Post-op visit Date/Instructions:   3-5 day voiding trial

## 2021-05-31 NOTE — Progress Notes (Signed)
post void residual =131mL

## 2021-06-01 ENCOUNTER — Telehealth: Payer: Self-pay

## 2021-06-01 NOTE — Progress Notes (Signed)
[facility] [place] Surgery Posting Form   Surgery Date/Time: Date: 06/22/2021  Surgeon: Dr. Wilkie Aye, MD  Surgery Location: Day Surgery  Inpt ( No  )   Outpt (Yes)   Obs ( No  )   Diagnosis: Urethral Stricture N35.819  -CPT: 24401  Surgery: Cystoscopy with Urethral Dilation  Stop Anticoagulations: Yes  Cardiac/Medical/Pulmonary Clearance needed: No  *Orders entered into EPIC  Date: 06/01/21   *Case booked in [place]  Date: 05/31/2021  *Notified pt of Surgery: Date: 05/31/2021  PRE-OP UA & CX: no  *Placed into Prior Authorization Work Angela Nevin Date: 06/01/21   Assistant/laser/rep:No

## 2021-06-01 NOTE — Telephone Encounter (Signed)
I spoke with Alan Gill. We have discussed possible surgery dates and Thursday March 16th, 2023 was agreed upon by all parties. Patient given information about surgery date, what to expect pre-operatively and post operatively.   We discussed that a pre-op nurse will be calling to set up the pre-op visit that will take place prior to surgery. Informed patient that our office will communicate any additional care to be provided after surgery.   Patients questions or concerns were discussed during our call. Advised to call our office should there be any additional information, questions or concerns that arise. Patient verbalized understanding.

## 2021-06-04 ENCOUNTER — Encounter: Payer: Self-pay | Admitting: Urology

## 2021-06-04 NOTE — Patient Instructions (Signed)
Urethral Stricture ?Urethral stricture is narrowing of the tube (urethra) that carries urine from the bladder out of the body. The urethra can become narrow due to scar tissue from an injury or infection. This can make it difficult to pass urine. ?In women, the urethra opens above the vaginal opening. In men, the urethra opens at the tip of the penis, and the urethra is much longer than it is in women. Because of the length of the male urethra, urethral stricture is much more common in men. This condition is treated with surgery. ?What are the causes? ?In both men and women, common causes of urethral stricture include: ?Urinary tract infection (UTI). ?Sexually transmitted infection (STI). ?Use of a tube placed into the urethra to drain urine from the bladder (urinary catheter). ?Urinary tract surgery. ?In men, common causes of urethral stricture include: ?A severe injury to the pelvis. ?Prostate surgery. ?Injury to the penis. ?In many cases, the cause of urethral stricture is not known. ?What increases the risk? ?You are more likely to develop this condition if you: ?Are male. Men who have had prostate surgery are at risk of developing this condition. ?Use a urinary catheter. ?Have had urinary tract surgery. ?What are the signs or symptoms? ?The main symptom of this condition is difficulty passing urine. This may cause decreased urine flow, dribbling, or spraying of urine. Other symptom of this condition may include: ?Frequent UTIs. ?Blood in the urine. ?Pain when urinating. ?Swelling of the penis in men. ?Inability to pass urine (urinary obstruction). ?How is this diagnosed? ?This condition may be diagnosed based on: ?Your medical history and a physical exam. ?Urine tests to check for infection or bleeding. ?X-rays. ?Ultrasound. ?Retrograde urethrogram. This is a type of test in which dye is injected into the urethra and then an X-ray is taken. ?Urethroscopy. This is when a thin tube with a light and camera on the  end (urethroscope) is used to look at the urethra. ?How is this treated? ?This condition is treated with surgery. The type of surgery that you have depends on the severity of your condition. You may have: ?Urethral dilation. In this procedure, the narrow part of the urethra is stretched open (dilated) with dilating instruments or a small balloon. ?Urethrotomy. In this procedure, a urethroscope is placed into the urethra, and the narrow part of the urethra is cut open with a surgical blade inserted through the urethroscope. ?Open surgery. In this procedure, an incision is made in the urethra, the narrow part is removed, and the urethra is reconstructed. ?Follow these instructions at home: ? ?Take over-the-counter and prescription medicines only as told by your health care provider. ?If you were prescribed an antibiotic medicine, take it as told by your health care provider. Do not stop taking the antibiotic even if you start to feel better. ?Drink enough fluid to keep your urine pale yellow. ?Keep all follow-up visits as told by your health care provider. This is important. ?Contact a health care provider if: ?You have signs of a urinary tract infection, such as: ?Frequent urination or passing small amounts of urine frequently. ?Needing to urinate urgently. ?Pain or burning with urination. ?Urine that smells bad or unusual. ?Cloudy urine. ?Pain in the lower abdomen or back. ?Trouble urinating. ?Blood in the urine. ?Vomiting or being less hungry than normal. ?Diarrhea or abdominal pain. ?Vaginal discharge, if you are male. ?Your symptoms are getting worse instead of better. ?Get help right away if: ?You cannot pass urine. ?You have a fever. ?You   have swelling, bruising, or discoloration of your genital area. This includes the penis, scrotum, and inner thighs for men, and the outer genital organs (vulva) and inner thighs for women. ?You develop swelling in your legs. ?You have difficulty breathing. ?Summary ?Urethral  stricture is narrowing of the tube (urethra) that carries urine from the bladder out of the body. The urethra can become narrow due to scar tissue from an injury or infection. ?This condition can make it difficult to pass urine. ?This condition is treated with surgery. The type of surgery that you have depends on the severity of your condition. ?Contact a health care provider if your symptoms get worse or you have signs of a urinary tract infection. ?This information is not intended to replace advice given to you by your health care provider. Make sure you discuss any questions you have with your health care provider. ?Document Revised: 11/06/2017 Document Reviewed: 11/06/2017 ?Elsevier Patient Education ? 2022 [employer] ? ?

## 2021-06-19 NOTE — Patient Instructions (Signed)
Alan Gill  06/19/2021     @   Your procedure is scheduled on 06/22/2021.   Report to [facility] at  [phone]  A.M.   Call this number if you have problems the morning of surgery:  [phone]   Remember:  Do not eat or drink after midnight.      Take these medicines the morning of surgery with A SIP OF WATER   None      Do not wear jewelry, make-up or nail polish.  Do not wear lotions, powders, or perfumes, or deodorant.  Do not shave 48 hours prior to surgery.  Men may shave face and neck.  Do not bring valuables to the hospital.  [facility] is not responsible for any belongings or valuables.  Contacts, dentures or bridgework may not be worn into surgery.  Leave your suitcase in the car.  After surgery it may be brought to your room.  For patients admitted to the hospital, discharge time will be determined by your treatment team.  Patients discharged the day of surgery will not be allowed to drive home and must have someone with them for 24 hours.    Special instructions:  DO NOT smoke tobacco or vape for 24 hours before your procedure.  Please read over the following fact sheets that you were given. Coughing and Deep Breathing, Surgical Site Infection Prevention, Anesthesia Post-op Instructions, and Care and Recovery After Surgery      General Anesthesia, Adult, Care After This sheet gives you information about how to care for yourself after your procedure. Your health care provider may also give you more specific instructions. If you have problems or questions, contact your health care provider. What can I expect after the procedure? After the procedure, the following side effects are common: Pain or discomfort at the IV site. Nausea. Vomiting. Sore throat. Trouble concentrating. Feeling cold or chills. Feeling weak or tired. Sleepiness and fatigue. Soreness and body aches. These side effects can affect parts of the body that were  not involved in surgery. Follow these instructions at home: For the time period you were told by your health care provider:  Rest. Do not participate in activities where you could fall or become injured. Do not drive or use machinery. Do not drink alcohol. Do not take sleeping pills or medicines that cause drowsiness. Do not make important decisions or sign legal documents. Do not take care of children on your own. Eating and drinking Follow any instructions from your health care provider about eating or drinking restrictions. When you feel hungry, start by eating small amounts of foods that are soft and easy to digest (bland), such as toast. Gradually return to your regular diet. Drink enough fluid to keep your urine pale yellow. If you vomit, rehydrate by drinking water, juice, or clear broth. General instructions If you have sleep apnea, surgery and certain medicines can increase your risk for breathing problems. Follow instructions from your health care provider about wearing your sleep device: Anytime you are sleeping, including during daytime naps. While taking prescription pain medicines, sleeping medicines, or medicines that make you drowsy. Have a responsible adult stay with you for the time you are told. It is important to have someone help care for you until you are awake and alert. Return to your normal activities as told by your health care provider. Ask your health care provider what activities are safe for you. Take over-the-counter and prescription medicines only  as told by your health care provider. If you smoke, do not smoke without supervision. Keep all follow-up visits as told by your health care provider. This is important. Contact a health care provider if: You have nausea or vomiting that does not get better with medicine. You cannot eat or drink without vomiting. You have pain that does not get better with medicine. You are unable to pass urine. You develop a  skin rash. You have a fever. You have redness around your IV site that gets worse. Get help right away if: You have difficulty breathing. You have chest pain. You have blood in your urine or stool, or you vomit blood. Summary After the procedure, it is common to have a sore throat or nausea. It is also common to feel tired. Have a responsible adult stay with you for the time you are told. It is important to have someone help care for you until you are awake and alert. When you feel hungry, start by eating small amounts of foods that are soft and easy to digest (bland), such as toast. Gradually return to your regular diet. Drink enough fluid to keep your urine pale yellow. Return to your normal activities as told by your health care provider. Ask your health care provider what activities are safe for you. This information is not intended to replace advice given to you by your health care provider. Make sure you discuss any questions you have with your health care provider. Document Revised: 12/10/2019 Document Reviewed: 07/09/2019 Elsevier Patient Education  2022 [employer] How to Use Chlorhexidine for Bathing Chlorhexidine gluconate (CHG) is a germ-killing (antiseptic) solution that is used to clean the skin. It can get rid of the bacteria that normally live on the skin and can keep them away for about 24 hours. To clean your skin with CHG, you may be given: A CHG solution to use in the shower or as part of a sponge bath. A prepackaged cloth that contains CHG. Cleaning your skin with CHG may help lower the risk for infection: While you are staying in the intensive care unit of the hospital. If you have a vascular access, such as a central line, to provide short-term or long-term access to your veins. If you have a catheter to drain urine from your bladder. If you are on a ventilator. A ventilator is a machine that helps you breathe by moving air in and out of your lungs. After  surgery. What are the risks? Risks of using CHG include: A skin reaction. Hearing loss, if CHG gets in your ears and you have a perforated eardrum. Eye injury, if CHG gets in your eyes and is not rinsed out. The CHG product catching fire. Make sure that you avoid smoking and flames after applying CHG to your skin. Do not use CHG: If you have a chlorhexidine allergy or have previously reacted to chlorhexidine. On babies younger than 31 months of age. How to use CHG solution Use CHG only as told by your health care provider, and follow the instructions on the label. Use the full amount of CHG as directed. Usually, this is one bottle. During a shower Follow these steps when using CHG solution during a shower (unless your health care provider gives you different instructions): Start the shower. Use your normal soap and shampoo to wash your face and hair. Turn off the shower or move out of the shower stream. Pour the CHG onto a clean washcloth. Do not use any type  of brush or rough-edged sponge. Starting at your neck, lather your body down to your toes. Make sure you follow these instructions: If you will be having surgery, pay special attention to the part of your body where you will be having surgery. Scrub this area for at least 1 minute. Do not use CHG on your head or face. If the solution gets into your ears or eyes, rinse them well with water. Avoid your genital area. Avoid any areas of skin that have broken skin, cuts, or scrapes. Scrub your back and under your arms. Make sure to wash skin folds. Let the lather sit on your skin for 1-2 minutes or as long as told by your health care provider. Thoroughly rinse your entire body in the shower. Make sure that all body creases and crevices are rinsed well. Dry off with a clean towel. Do not put any substances on your body afterward--such as powder, lotion, or perfume--unless you are told to do so by your health care provider. Only use lotions  that are recommended by the manufacturer. Put on clean clothes or pajamas. If it is the night before your surgery, sleep in clean sheets.  During a sponge bath Follow these steps when using CHG solution during a sponge bath (unless your health care provider gives you different instructions): Use your normal soap and shampoo to wash your face and hair. Pour the CHG onto a clean washcloth. Starting at your neck, lather your body down to your toes. Make sure you follow these instructions: If you will be having surgery, pay special attention to the part of your body where you will be having surgery. Scrub this area for at least 1 minute. Do not use CHG on your head or face. If the solution gets into your ears or eyes, rinse them well with water. Avoid your genital area. Avoid any areas of skin that have broken skin, cuts, or scrapes. Scrub your back and under your arms. Make sure to wash skin folds. Let the lather sit on your skin for 1-2 minutes or as long as told by your health care provider. Using a different clean, wet washcloth, thoroughly rinse your entire body. Make sure that all body creases and crevices are rinsed well. Dry off with a clean towel. Do not put any substances on your body afterward--such as powder, lotion, or perfume--unless you are told to do so by your health care provider. Only use lotions that are recommended by the manufacturer. Put on clean clothes or pajamas. If it is the night before your surgery, sleep in clean sheets. How to use CHG prepackaged cloths Only use CHG cloths as told by your health care provider, and follow the instructions on the label. Use the CHG cloth on clean, dry skin. Do not use the CHG cloth on your head or face unless your health care provider tells you to. When washing with the CHG cloth: Avoid your genital area. Avoid any areas of skin that have broken skin, cuts, or scrapes. Before surgery Follow these steps when using a CHG cloth to  clean before surgery (unless your health care provider gives you different instructions): Using the CHG cloth, vigorously scrub the part of your body where you will be having surgery. Scrub using a back-and-forth motion for 3 minutes. The area on your body should be completely wet with CHG when you are done scrubbing. Do not rinse. Discard the cloth and let the area air-dry. Do not put any substances on the area  afterward, such as powder, lotion, or perfume. Put on clean clothes or pajamas. If it is the night before your surgery, sleep in clean sheets.  For general bathing Follow these steps when using CHG cloths for general bathing (unless your health care provider gives you different instructions). Use a separate CHG cloth for each area of your body. Make sure you wash between any folds of skin and between your fingers and toes. Wash your body in the following order, switching to a new cloth after each step: The front of your neck, shoulders, and chest. Both of your arms, under your arms, and your hands. Your stomach and groin area, avoiding the genitals. Your right leg and foot. Your left leg and foot. The back of your neck, your back, and your buttocks. Do not rinse. Discard the cloth and let the area air-dry. Do not put any substances on your body afterward--such as powder, lotion, or perfume--unless you are told to do so by your health care provider. Only use lotions that are recommended by the manufacturer. Put on clean clothes or pajamas. Contact a health care provider if: Your skin gets irritated after scrubbing. You have questions about using your solution or cloth. You swallow any chlorhexidine. Call your local poison control center [phone] in the [place] Get help right away if: Your eyes itch badly, or they become very red or swollen. Your skin itches badly and is red or swollen. Your hearing changes. You have trouble seeing. You have swelling or tingling in your mouth or  throat. You have trouble breathing. These symptoms may represent a serious problem that is an emergency. Do not wait to see if the symptoms will go away. Get medical help right away. Call your local emergency services (911 in the [place] Do not drive yourself to the hospital. Summary Chlorhexidine gluconate (CHG) is a germ-killing (antiseptic) solution that is used to clean the skin. Cleaning your skin with CHG may help to lower your risk for infection. You may be given CHG to use for bathing. It may be in a bottle or in a prepackaged cloth to use on your skin. Carefully follow your health care provider's instructions and the instructions on the product label. Do not use CHG if you have a chlorhexidine allergy. Contact your health care provider if your skin gets irritated after scrubbing. This information is not intended to replace advice given to you by your health care provider. Make sure you discuss any questions you have with your health care provider. Document Revised: 06/06/2020 Document Reviewed: 06/06/2020 Elsevier Patient Education  2022 [employer].

## 2021-06-20 ENCOUNTER — Encounter (HOSPITAL_COMMUNITY)
Admission: RE | Admit: 2021-06-20 | Discharge: 2021-06-20 | Disposition: A | Discharge status: Home / Self Care | Payer: No Typology Code available for payment source | Source: Ambulatory Visit | Attending: Urology | Admitting: Urology

## 2021-06-20 ENCOUNTER — Encounter (HOSPITAL_COMMUNITY): Payer: Self-pay

## 2021-06-22 ENCOUNTER — Ambulatory Visit (HOSPITAL_COMMUNITY)
Admission: RE | Admit: 2021-06-22 | Discharge: 2021-06-22 | Disposition: A | Discharge status: Home / Self Care | Payer: PRIVATE HEALTH INSURANCE | Attending: Urology | Admitting: Urology

## 2021-06-22 ENCOUNTER — Encounter (HOSPITAL_COMMUNITY)
Admission: RE | Disposition: A | Discharge status: Home / Self Care | Payer: Self-pay | Source: Home / Self Care | Attending: Urology

## 2021-06-22 ENCOUNTER — Ambulatory Visit (HOSPITAL_COMMUNITY): Payer: PRIVATE HEALTH INSURANCE | Admitting: Certified Registered Nurse Anesthetist

## 2021-06-22 ENCOUNTER — Encounter (HOSPITAL_COMMUNITY): Payer: Self-pay | Admitting: Urology

## 2021-06-22 ENCOUNTER — Ambulatory Visit (HOSPITAL_BASED_OUTPATIENT_CLINIC_OR_DEPARTMENT_OTHER): Payer: PRIVATE HEALTH INSURANCE | Admitting: Certified Registered Nurse Anesthetist

## 2021-06-22 DIAGNOSIS — N138 Other obstructive and reflux uropathy: Secondary | ICD-10-CM | POA: Diagnosis not present

## 2021-06-22 DIAGNOSIS — N401 Enlarged prostate with lower urinary tract symptoms: Secondary | ICD-10-CM | POA: Insufficient documentation

## 2021-06-22 DIAGNOSIS — R351 Nocturia: Secondary | ICD-10-CM | POA: Insufficient documentation

## 2021-06-22 DIAGNOSIS — N35919 Unspecified urethral stricture, male, unspecified site: Secondary | ICD-10-CM | POA: Diagnosis present

## 2021-06-22 DIAGNOSIS — R3916 Straining to void: Secondary | ICD-10-CM | POA: Diagnosis not present

## 2021-06-22 DIAGNOSIS — K219 Gastro-esophageal reflux disease without esophagitis: Secondary | ICD-10-CM | POA: Insufficient documentation

## 2021-06-22 HISTORY — PX: CYSTOSCOPY WITH URETHRAL DILATATION: SHX5125

## 2021-06-22 SURGERY — CYSTOSCOPY, WITH URETHRAL DILATION
Anesthesia: General | Site: Ureter

## 2021-06-22 MED ORDER — FENTANYL CITRATE (PF) 100 MCG/2ML IJ SOLN
INTRAMUSCULAR | Status: AC
  Filled 2021-06-22: qty 2

## 2021-06-22 MED ORDER — MIDAZOLAM HCL 2 MG/2ML IJ SOLN
INTRAMUSCULAR | Status: AC
  Filled 2021-06-22: qty 2

## 2021-06-22 MED ORDER — MIDAZOLAM HCL 2 MG/2ML IJ SOLN
INTRAMUSCULAR | Status: DC | PRN
  Administered 2021-06-22: 2 mg via INTRAVENOUS

## 2021-06-22 MED ORDER — HYDROCODONE-ACETAMINOPHEN 5-325 MG PO TABS: 1.0000 | ORAL_TABLET | Freq: Four times a day (QID) | ORAL | 0 refills | Status: AC | PRN

## 2021-06-22 MED ORDER — LIDOCAINE HCL (PF) 2 % IJ SOLN
INTRAMUSCULAR | Status: AC
  Filled 2021-06-22: qty 5

## 2021-06-22 MED ORDER — FENTANYL CITRATE (PF) 250 MCG/5ML IJ SOLN
INTRAMUSCULAR | Status: DC | PRN
  Administered 2021-06-22: 25 ug via INTRAVENOUS
  Administered 2021-06-22: 50 ug via INTRAVENOUS
  Administered 2021-06-22: 25 ug via INTRAVENOUS

## 2021-06-22 MED ORDER — ORAL CARE MOUTH RINSE: 15.0000 mL | Freq: Once | OROMUCOSAL | Status: DC

## 2021-06-22 MED ORDER — WATER FOR IRRIGATION, STERILE IR SOLN
Status: DC | PRN
  Administered 2021-06-22: 3000 mL
  Administered 2021-06-22: 1000 mL

## 2021-06-22 MED ORDER — ONDANSETRON HCL 4 MG/2ML IJ SOLN
INTRAMUSCULAR | Status: DC | PRN
  Administered 2021-06-22: 4 mg via INTRAVENOUS

## 2021-06-22 MED ORDER — LACTATED RINGERS IV SOLN: INTRAVENOUS | Status: DC | PRN

## 2021-06-22 MED ORDER — LIDOCAINE HCL (CARDIAC) PF 100 MG/5ML IV SOSY
PREFILLED_SYRINGE | INTRAVENOUS | Status: DC | PRN
  Administered 2021-06-22: 50 mg via INTRAVENOUS

## 2021-06-22 MED ORDER — CHLORHEXIDINE GLUCONATE 0.12 % MT SOLN: 15.0000 mL | Freq: Once | OROMUCOSAL | Status: DC

## 2021-06-22 MED ORDER — LACTATED RINGERS IV SOLN: INTRAVENOUS | Status: DC

## 2021-06-22 MED ORDER — PROPOFOL 10 MG/ML IV BOLUS
INTRAVENOUS | Status: AC
  Filled 2021-06-22: qty 20

## 2021-06-22 MED ORDER — CEFAZOLIN SODIUM-DEXTROSE 2-4 GM/100ML-% IV SOLN
INTRAVENOUS | Status: AC
  Filled 2021-06-22: qty 100

## 2021-06-22 MED ORDER — PROPOFOL 10 MG/ML IV BOLUS
INTRAVENOUS | Status: DC | PRN
  Administered 2021-06-22: 200 mg via INTRAVENOUS

## 2021-06-22 MED ORDER — CEFAZOLIN SODIUM-DEXTROSE 2-4 GM/100ML-% IV SOLN
2.0000 g | INTRAVENOUS | Status: AC
  Administered 2021-06-22: 2 g via INTRAVENOUS

## 2021-06-22 MED ORDER — ONDANSETRON HCL 4 MG/2ML IJ SOLN
INTRAMUSCULAR | Status: AC
  Filled 2021-06-22: qty 2

## 2021-06-22 SURGICAL SUPPLY — 21 items
BAG DRAIN URO TABLE W/ADPT NS (BAG) ×2 IMPLANT
BAG DRN 8 ADPR NS SKTRN CSTL (BAG) ×1
BAG DRN RND TRDRP ANRFLXCHMBR (UROLOGICAL SUPPLIES) ×1
BAG HAMPER (MISCELLANEOUS) ×2 IMPLANT
BAG URINE DRAIN 2000ML AR STRL (UROLOGICAL SUPPLIES) ×2 IMPLANT
BALLN NEPHROSTOMY (BALLOONS) ×2
BALLOON NEPHROSTOMY (BALLOONS) ×1 IMPLANT
CATH FOLEY 2W 5CC 18FR LF (CATHETERS) ×1 IMPLANT
CLOTH BEACON ORANGE TIMEOUT ST (SAFETY) ×2 IMPLANT
GLOVE SURG POLYISO LF SZ8 (GLOVE) ×2 IMPLANT
GLOVE SURG UNDER POLY LF SZ7 (GLOVE) ×4 IMPLANT
GOWN STRL REUS W/TWL LRG LVL3 (GOWN DISPOSABLE) ×2 IMPLANT
GOWN STRL REUS W/TWL XL LVL3 (GOWN DISPOSABLE) ×2 IMPLANT
GUIDEWIRE STR DUAL SENSOR (WIRE) ×2 IMPLANT
KIT TURNOVER CYSTO (KITS) ×2 IMPLANT
MANIFOLD NEPTUNE II (INSTRUMENTS) ×2 IMPLANT
PACK CYSTO (CUSTOM PROCEDURE TRAY) ×2 IMPLANT
PAD ARMBOARD 7.5X6 YLW CONV (MISCELLANEOUS) ×2 IMPLANT
TOWEL NATURAL 4PK STERILE (DISPOSABLE) ×2 IMPLANT
WATER STERILE IRR 3000ML UROMA (IV SOLUTION) ×2 IMPLANT
WATER STERILE IRR 500ML POUR (IV SOLUTION) ×2 IMPLANT

## 2021-06-22 NOTE — Transfer of Care (Signed)
Immediate Anesthesia Transfer of Care Note ? ?Patient: Alan Gill ? ?Procedure(s) Performed: CYSTOSCOPY WITH URETHRAL DILATATION (Ureter) ? ?Patient Location: PACU ? ?Anesthesia Type:General ? ?Level of Consciousness: drowsy ? ?Airway & Oxygen Therapy: Patient Spontanous Breathing and Patient connected to nasal cannula oxygen ? ?Post-op Assessment: Report given to RN and Post -op Vital signs reviewed and stable ? ?Post vital signs: Reviewed and stable ? ?Last Vitals:  ?Vitals Value Taken Time  ?BP 104/63   ?Temp    ?Pulse 64   ?Resp 12   ?SpO2 95%   ? ? ?Last Pain:  ?Vitals:  ? 06/22/21 0702  ?TempSrc: Oral  ?PainSc: 0-No pain  ?   ? ?Patients Stated Pain Goal: 7 (06/22/21 9485) ? ?Complications: No notable events documented. ?

## 2021-06-22 NOTE — Op Note (Signed)
Preoperative diagnosis: urethral stricture ? ?Postoperative diagnosis: Same ? ?Procedure: 1. Cystoscopy ?2. Balloon dilation of a urethral stricture ? ?Attending: Nicolette Bang ? ?Anesthesia: General ? ?History of blood loss: Minimal ? ?Antibiotics: Ancef ? ?Specimens: none ? ?Drains: 18 french silicone catheter ? ?Findings: dense less than 1cm bulbar urethral stricture.  No masses/lesions in the bladder. ? ?Indications: Patient is a 61 year old male with a history of difficulty with urination and was found to have a urethral stricture. After discussing treatment options the patient wishes to proceed with urethral dilation. ? ?Procedure in detail: Prior to procedure consent was obtained.  Patient was brought to the operating room and a brief timeout was done to ensure correct patient, correct procedure, correct site.  General anesthesia was administered and patient was placed in dorsal lithotomy position.  His genitalia was then prepped and draped in usual sterile fashion. We then passed a 1 French cystoscope up to the bulbar urethra where we encountered a dense stricture. A sensor wire was passed into bladder. Over the wire was passed a 20 french balloon dilator. We then inflated the balloon to 18cm of water. This was held for 2 minutes and the balloon was deflated. We then passed the cystoscope into the bladder. . We inspected the bladder and no lesions, masses, or calculi were noted. We then removed the cystoscope and placed an 18 French silicone catheter.  This then concluded the procedure which was well tolerated by the patient. ? ?Complications: None ? ?Condition: Stable, extubated, transferred to PACU. ? ?Plan: Patient is to be discharged home.  He is to follow up in 1 week for voiding trial. ? ?

## 2021-06-22 NOTE — Interval H&P Note (Signed)
History and Physical Interval Note: ? ?06/22/2021 ?7:34 AM ? ?Alan Gill  has presented today for surgery, with the diagnosis of Urethral Stricture.  The various methods of treatment have been discussed with the patient and family. After consideration of risks, benefits and other options for treatment, the patient has consented to  Procedure(s): ?CYSTOSCOPY WITH URETHRAL DILATATION (N/A) as a surgical intervention.  The patient's history has been reviewed, patient examined, no change in status, stable for surgery.  I have reviewed the patient's chart and labs.  Questions were answered to the patient's satisfaction.   ? ? ?Wilkie Aye ? ? ?

## 2021-06-22 NOTE — Anesthesia Preprocedure Evaluation (Signed)
Anesthesia Evaluation  ?Patient identified by MRN, date of birth, ID band ?Patient awake ? ? ? ?Reviewed: ?Allergy & Precautions, H&P , NPO status , Patient's Chart, lab work & pertinent test results, reviewed documented beta blocker date and time  ? ?Airway ?Mallampati: II ? ?TM Distance: >3 FB ?Neck ROM: full ? ? ? Dental ?no notable dental hx. ? ?  ?Pulmonary ?neg pulmonary ROS,  ?  ?Pulmonary exam normal ?breath sounds clear to auscultation ? ? ? ? ? ? Cardiovascular ?Exercise Tolerance: Good ?negative cardio ROS ? ? ?Rhythm:regular Rate:Normal ? ? ?  ?Neuro/Psych ? Neuromuscular disease negative psych ROS  ? GI/Hepatic ?Neg liver ROS, GERD  Medicated,  ?Endo/Other  ?negative endocrine ROS ? Renal/GU ?Renal disease  ?negative genitourinary ?  ?Musculoskeletal ? ? Abdominal ?  ?Peds ? Hematology ?negative hematology ROS ?(+)   ?Anesthesia Other Findings ? ? Reproductive/Obstetrics ?negative OB ROS ? ?  ? ? ? ? ? ? ? ? ? ? ? ? ? ?  ?  ? ? ? ? ? ? ? ? ?Anesthesia Physical ?Anesthesia Plan ? ?ASA: 2 ? ?Anesthesia Plan: General and General LMA  ? ?Post-op Pain Management:   ? ?Induction:  ? ?PONV Risk Score and Plan: Ondansetron ? ?Airway Management Planned:  ? ?Additional Equipment:  ? ?Intra-op Plan:  ? ?Post-operative Plan:  ? ?Informed Consent: I have reviewed the patients History and Physical, chart, labs and discussed the procedure including the risks, benefits and alternatives for the proposed anesthesia with the patient or authorized representative who has indicated his/her understanding and acceptance.  ? ? ? ?Dental Advisory Given ? ?Plan Discussed with: CRNA ? ?Anesthesia Plan Comments:   ? ? ? ? ? ? ?Anesthesia Quick Evaluation ? ?

## 2021-06-22 NOTE — Anesthesia Postprocedure Evaluation (Signed)
Anesthesia Post Note ? ?Patient: Alan Gill ? ?Procedure(s) Performed: CYSTOSCOPY WITH URETHRAL DILATATION (Ureter) ? ?Patient location during evaluation: Phase II ?Anesthesia Type: General ?Level of consciousness: awake ?Pain management: pain level controlled ?Vital Signs Assessment: post-procedure vital signs reviewed and stable ?Respiratory status: spontaneous breathing and respiratory function stable ?Cardiovascular status: blood pressure returned to baseline and stable ?Postop Assessment: no headache and no apparent nausea or vomiting ?Anesthetic complications: no ?Comments: Late entry ? ? ?No notable events documented. ? ? ?Last Vitals:  ?Vitals:  ? 06/22/21 0702 06/22/21 0735  ?BP: 132/77 127/82  ?Pulse: 77 72  ?Resp: 20   ?Temp: 36.5 ?C   ?SpO2: 96% 99%  ?  ?Last Pain:  ?Vitals:  ? 06/22/21 0702  ?TempSrc: Oral  ?PainSc: 0-No pain  ? ? ?  ?  ?  ?  ?  ?  ? ?Windell Norfolk ? ? ? ? ?

## 2021-06-22 NOTE — Anesthesia Procedure Notes (Signed)
Procedure Name: LMA Insertion ?Date/Time: 06/22/2021 7:52 AM ?Performed by: Karna Dupes, CRNA ?Pre-anesthesia Checklist: Patient identified, Emergency Drugs available, Suction available and Patient being monitored ?Patient Re-evaluated:Patient Re-evaluated prior to induction ?Oxygen Delivery Method: Circle system utilized ?Preoxygenation: Pre-oxygenation with 100% oxygen ?Induction Type: IV induction ?LMA: LMA inserted ?LMA Size: 4.0 ?Number of attempts: 1 ?Placement Confirmation: breath sounds checked- equal and bilateral ?Tube secured with: Tape ?Dental Injury: Teeth and Oropharynx as per pre-operative assessment  ? ? ? ? ?

## 2021-06-23 ENCOUNTER — Other Ambulatory Visit: Payer: Self-pay

## 2021-06-23 ENCOUNTER — Ambulatory Visit (INDEPENDENT_AMBULATORY_CARE_PROVIDER_SITE_OTHER): Payer: No Typology Code available for payment source

## 2021-06-23 ENCOUNTER — Encounter (HOSPITAL_COMMUNITY): Payer: Self-pay | Admitting: Urology

## 2021-06-23 DIAGNOSIS — N35819 Other urethral stricture, male, unspecified site: Secondary | ICD-10-CM

## 2021-06-23 NOTE — Progress Notes (Signed)
Patient came in with complaints of leaking from his catheter when he feels the need to urinate.  Explained that this could be normal when experiencing a bladder spasm.  Patient denies discomfort from the catheter and had in bed bag.  Assured patient the catheter is in place. Patient will follow up on Monday 06/26/21 for void and trial. ? ?Kourtland Coopman, CMA ?

## 2021-06-26 ENCOUNTER — Ambulatory Visit (INDEPENDENT_AMBULATORY_CARE_PROVIDER_SITE_OTHER): Payer: No Typology Code available for payment source | Admitting: Urology

## 2021-06-26 ENCOUNTER — Encounter: Payer: Self-pay | Admitting: Urology

## 2021-06-26 ENCOUNTER — Other Ambulatory Visit: Payer: Self-pay

## 2021-06-26 VITALS — BP 114/72 | HR 79

## 2021-06-26 DIAGNOSIS — R351 Nocturia: Secondary | ICD-10-CM

## 2021-06-26 DIAGNOSIS — N35819 Other urethral stricture, male, unspecified site: Secondary | ICD-10-CM | POA: Diagnosis not present

## 2021-06-26 DIAGNOSIS — N401 Enlarged prostate with lower urinary tract symptoms: Secondary | ICD-10-CM | POA: Diagnosis not present

## 2021-06-26 DIAGNOSIS — N138 Other obstructive and reflux uropathy: Secondary | ICD-10-CM

## 2021-06-26 MED ORDER — HYDROXYZINE HCL 10 MG PO TABS: 10.0000 mg | ORAL_TABLET | Freq: Every day | ORAL | 3 refills | Status: DC

## 2021-06-26 MED ORDER — CLOTRIMAZOLE-BETAMETHASONE 1-0.05 % EX CREA: 1.0000 "application " | TOPICAL_CREAM | Freq: Two times a day (BID) | CUTANEOUS | 0 refills | Status: AC

## 2021-06-26 NOTE — Progress Notes (Signed)
Fill and Pull Catheter Removal ? ?Patient is present today for a catheter removal.  Patient was cleaned and prepped in a sterile fashion of sterile water/ saline was instilled into the bladder when the patient felt the urge to urinate. 3ml of water was then drained from the balloon.  A 16FR foley cath was removed from the bladder no complications were noted .  Patient as then given some time to void on their own.  Patient can void  on their own after some time.  Patient tolerated well. ? ?Performed by: Santiaga Butzin LPN ? ?Follow up/ Additional notes: Per MD note  ?

## 2021-06-26 NOTE — Progress Notes (Signed)
? ?06/26/2021 ?9:06 AM  ? ?Loden Alber ?06/16/1960 ?[id number] ? ?Referring provider: Neale Burly, MD ?[place] ?[address],  [place] [address] ? ?Followup urethral stricture ? ? ?HPI: ?Mr Apo is a 61yo here for followup after urethral dilation. Voiding trial passed. He denies any pelvic pain. Urine stream was strong after foley removal. No hematuria or dysuria. No other complaints today ? ? ?PMH: ?Past Medical History:  ?Diagnosis Date  ? Carpal tunnel syndrome on both sides   ? GERD (gastroesophageal reflux disease)   ? ? ?Surgical History: ?Past Surgical History:  ?Procedure Laterality Date  ? COLONOSCOPY N/A 02/20/2016  ? Procedure: COLONOSCOPY;  Surgeon: Daneil Dolin, MD;  [facility];  Service: Endoscopy;  Laterality: N/A;  200  ? CYSTOSCOPY WITH URETHRAL DILATATION N/A 06/22/2021  ? Procedure: CYSTOSCOPY WITH URETHRAL DILATATION;  Surgeon: Cleon Gustin, MD;  Location: [facility];  Service: Urology;  Laterality: N/A;  ? EXTRACORPOREAL SHOCK WAVE LITHOTRIPSY Right 05/24/2020  ? Procedure: EXTRACORPOREAL SHOCK WAVE LITHOTRIPSY (ESWL);  Surgeon: Cleon Gustin, MD;  Location: [facility];  Service: Urology;  Laterality: Right;  ? WRIST SURGERY Bilateral   ? carpal tunnel  ? ? ?Home Medications:  ?Allergies as of 06/26/2021   ?No Known Allergies ?  ? ?  ?Medication List  ?  ? ?  ? Accurate as of June 26, 2021  9:06 AM. If you have any questions, ask your nurse or doctor.  ?  ?  ? ?  ? ?alfuzosin 10 MG 24 hr tablet ?Commonly known as: UROXATRAL ?Take 1 tablet (10 mg total) by mouth daily with breakfast. ?  ?clotrimazole-betamethasone cream ?Commonly known as: Lotrisone ?Apply 1 application. topically 2 (two) times daily. ?Started by: Nicolette Bang, MD ?  ?HYDROcodone-acetaminophen 5-325 MG tablet ?Commonly known as: Norco ?Take 1 tablet by mouth every 6 (six) hours as needed for moderate pain. ?  ?hydrOXYzine 10 MG tablet ?Commonly known as: ATARAX ?Take 1 tablet (10 mg total) by mouth at  bedtime. ?  ?magnesium oxide 400 MG tablet ?Commonly known as: MAG-OX ?Take 400 mg by mouth daily. ?  ?mirabegron ER 25 MG Tb24 tablet ?Commonly known as: MYRBETRIQ ?Take 1 tablet (25 mg total) by mouth daily. ?  ?mirabegron ER 25 MG Tb24 tablet ?Commonly known as: MYRBETRIQ ?Take 1 tablet (25 mg total) by mouth daily. ?  ?multivitamin with minerals tablet ?Take 1 tablet by mouth daily. ?  ?silodosin 8 MG Caps capsule ?Commonly known as: RAPAFLO ?Take 1 capsule (8 mg total) by mouth at bedtime. ?  ?SUPER B COMPLEX PO ?Take 1 tablet by mouth daily. ?  ?tadalafil 5 MG tablet ?Commonly known as: CIALIS ?Take 5 mg by mouth daily as needed for erectile dysfunction. ?  ?vitamin B-12 1000 MCG tablet ?Commonly known as: CYANOCOBALAMIN ?Take 1,000 mcg by mouth daily. ?  ?vitamin C 500 MG tablet ?Commonly known as: ASCORBIC ACID ?Take 500 mg by mouth daily. ?  ?Vitamin D3 10 MCG (400 UNIT) tablet ?Take 400 Units by mouth daily. ?  ?vitamin E 180 MG (400 UNITS) capsule ?Take 400 Units by mouth daily. ?  ?zinc gluconate 50 MG tablet ?Take 50 mg by mouth daily. ?  ? ?  ? ? ?Allergies: No Known Allergies ? ?Family History: ?Family History  ?Problem Relation Age of Onset  ? Healthy Mother   ? Liver cancer Mother   ? Healthy Father   ? Bladder Cancer Father   ? Healthy Sister   ?  Cancer Maternal Grandmother   ?     breast  ? Early death Sister   ? Drug abuse Son   ? Bladder Cancer Paternal Uncle   ? Colon cancer Neg Hx   ? ? ?Social History:  reports that he has never smoked. He has never used smokeless tobacco. He reports that he does not drink alcohol and does not use drugs. ? ?ROS: ?All other review of systems were reviewed and are negative except what is noted above in HPI ? ?Physical Exam: ?BP 114/72   Pulse 79   ?Constitutional:  Alert and oriented, No acute distress. ?HEENT: [place] AT, moist mucus membranes.  Trachea midline, no masses. ?Cardiovascular: No clubbing, cyanosis, or edema. ?Respiratory: Normal respiratory effort,  no increased work of breathing. ?GI: Abdomen is soft, nontender, nondistended, no abdominal masses ?GU: No CVA tenderness.  ?Lymph: No cervical or inguinal lymphadenopathy. ?Skin: No rashes, bruises or suspicious lesions. ?Neurologic: Grossly intact, no focal deficits, moving all 4 extremities. ?Psychiatric: Normal mood and affect. ? ?Laboratory Data: ?No results found for: WBC, HGB, HCT, MCV, PLT ? ?Lab Results  ?Component Value Date  ? CREATININE 0.57 (L) 06/27/2015  ? ? ?No results found for: PSA ? ?No results found for: TESTOSTERONE ? ?No results found for: HGBA1C ? ?Urinalysis ?   ?Component Value Date/Time  ? APPEARANCEUR Clear 05/31/2021 0935  ? GLUCOSEU Negative 05/31/2021 0935  ? BILIRUBINUR Negative 05/31/2021 0935  ? PROTEINUR Negative 05/31/2021 0935  ? UROBILINOGEN negative 06/10/2013 0956  ? NITRITE Negative 05/31/2021 0935  ? LEUKOCYTESUR Negative 05/31/2021 0935  ? ? ?Lab Results  ?Component Value Date  ? LABMICR Comment 05/31/2021  ? [place] None seen 07/15/2020  ? LABEPIT None seen 07/15/2020  ? MUCUS Present 05/23/2020  ? BACTERIA None seen 07/15/2020  ? ? ?Pertinent Imaging: ? ?Results for orders placed during the hospital encounter of 02/28/21 ? ?Abdomen 1 view (KUB) ? ?Narrative ?CLINICAL DATA:  Kidney stone. ? ?EXAM: ?ABDOMEN - 1 VIEW ? ?COMPARISON:  Abdominal radiograph dated 07/14/2020. ? ?FINDINGS: ?No bowel dilatation or evidence of obstruction. No free air or ?radiopaque calculi. Degenerative changes of the spine. No acute ?osseous pathology. The soft tissues are unremarkable. ? ?IMPRESSION: ?Negative. ? ? ?Electronically Signed ?By: Anner Crete M.D. ?On: 02/28/2021 21:10 ? ?No results found for this or any previous visit. ? ?No results found for this or any previous visit. ? ?No results found for this or any previous visit. ? ?No results found for this or any previous visit. ? ?No results found for this or any previous visit. ? ?No results found for this or any previous visit. ? ?No  results found for this or any previous visit. ? ? ?Assessment & Plan:   ? ?1. 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 ?2. Benign prostatic hyperplasia with urinary obstruction ?Continue rapaflo 8mg  daily ? ?3. Nocturia ?Continue rapaflo 8mg  daily. Continue hydroxyzine 10mg  qhs ? ? ?No follow-ups on file. ? ?Nicolette Bang, MD ? ?[facility] [place] ?  ?

## 2021-06-26 NOTE — Patient Instructions (Signed)
Urethral Stricture ?Urethral stricture is narrowing of the tube (urethra) that carries urine from the bladder out of the body. The urethra can become narrow due to scar tissue from an injury or infection. This can make it difficult to pass urine. ?In women, the urethra opens above the vaginal opening. In men, the urethra opens at the tip of the penis, and the urethra is much longer than it is in women. Because of the length of the male urethra, urethral stricture is much more common in men. This condition is treated with surgery. ?What are the causes? ?In both men and women, common causes of urethral stricture include: ?Urinary tract infection (UTI). ?Sexually transmitted infection (STI). ?Use of a tube placed into the urethra to drain urine from the bladder (urinary catheter). ?Urinary tract surgery. ?In men, common causes of urethral stricture include: ?A severe injury to the pelvis. ?Prostate surgery. ?Injury to the penis. ?In many cases, the cause of urethral stricture is not known. ?What increases the risk? ?You are more likely to develop this condition if you: ?Are male. Men who have had prostate surgery are at risk of developing this condition. ?Use a urinary catheter. ?Have had urinary tract surgery. ?What are the signs or symptoms? ?The main symptom of this condition is difficulty passing urine. This may cause decreased urine flow, dribbling, or spraying of urine. Other symptom of this condition may include: ?Frequent UTIs. ?Blood in the urine. ?Pain when urinating. ?Swelling of the penis in men. ?Inability to pass urine (urinary obstruction). ?How is this diagnosed? ?This condition may be diagnosed based on: ?Your medical history and a physical exam. ?Urine tests to check for infection or bleeding. ?X-rays. ?Ultrasound. ?Retrograde urethrogram. This is a type of test in which dye is injected into the urethra and then an X-ray is taken. ?Urethroscopy. This is when a thin tube with a light and camera on the  end (urethroscope) is used to look at the urethra. ?How is this treated? ?This condition is treated with surgery. The type of surgery that you have depends on the severity of your condition. You may have: ?Urethral dilation. In this procedure, the narrow part of the urethra is stretched open (dilated) with dilating instruments or a small balloon. ?Urethrotomy. In this procedure, a urethroscope is placed into the urethra, and the narrow part of the urethra is cut open with a surgical blade inserted through the urethroscope. ?Open surgery. In this procedure, an incision is made in the urethra, the narrow part is removed, and the urethra is reconstructed. ?Follow these instructions at home: ? ?Take over-the-counter and prescription medicines only as told by your health care provider. ?If you were prescribed an antibiotic medicine, take it as told by your health care provider. Do not stop taking the antibiotic even if you start to feel better. ?Drink enough fluid to keep your urine pale yellow. ?Keep all follow-up visits as told by your health care provider. This is important. ?Contact a health care provider if: ?You have signs of a urinary tract infection, such as: ?Frequent urination or passing small amounts of urine frequently. ?Needing to urinate urgently. ?Pain or burning with urination. ?Urine that smells bad or unusual. ?Cloudy urine. ?Pain in the lower abdomen or back. ?Trouble urinating. ?Blood in the urine. ?Vomiting or being less hungry than normal. ?Diarrhea or abdominal pain. ?Vaginal discharge, if you are male. ?Your symptoms are getting worse instead of better. ?Get help right away if: ?You cannot pass urine. ?You have a fever. ?You   have swelling, bruising, or discoloration of your genital area. This includes the penis, scrotum, and inner thighs for men, and the outer genital organs (vulva) and inner thighs for women. ?You develop swelling in your legs. ?You have difficulty breathing. ?Summary ?Urethral  stricture is narrowing of the tube (urethra) that carries urine from the bladder out of the body. The urethra can become narrow due to scar tissue from an injury or infection. ?This condition can make it difficult to pass urine. ?This condition is treated with surgery. The type of surgery that you have depends on the severity of your condition. ?Contact a health care provider if your symptoms get worse or you have signs of a urinary tract infection. ?This information is not intended to replace advice given to you by your health care provider. Make sure you discuss any questions you have with your health care provider. ?Document Revised: 11/06/2017 Document Reviewed: 11/06/2017 ?Elsevier Patient Education ? 2022 [employer] ? ?

## 2021-07-24 ENCOUNTER — Ambulatory Visit: Payer: No Typology Code available for payment source | Admitting: Physician Assistant

## 2021-07-24 VITALS — BP 110/71 | HR 91

## 2021-07-24 DIAGNOSIS — N138 Other obstructive and reflux uropathy: Secondary | ICD-10-CM | POA: Diagnosis not present

## 2021-07-24 DIAGNOSIS — N401 Enlarged prostate with lower urinary tract symptoms: Secondary | ICD-10-CM

## 2021-07-24 MED ORDER — TAMSULOSIN HCL 0.4 MG PO CAPS: 0.4000 mg | ORAL_CAPSULE | Freq: Every day | ORAL | 11 refills | Status: DC

## 2021-07-24 MED ORDER — ALFUZOSIN HCL ER 10 MG PO TB24: 10.0000 mg | ORAL_TABLET | Freq: Every day | ORAL | 11 refills | Status: AC

## 2021-07-24 NOTE — Progress Notes (Signed)
Uroflow ? ?Peak Flow: 21ml ?Average Flow: 55ml ?Voided Volume: 26ml ?Voiding Time: 22sec ?Flow Time: 11sec ?Time to Peak Flow: 13sec ? ?PVR Volume: 69ml  ?

## 2021-07-24 NOTE — Progress Notes (Signed)
? ?Assessment: ?1. Benign prostatic hyperplasia with urinary obstruction   ? ? ?Plan: ?Continue Uroxatrol and resume Flomax when Rapaflo Rx is complete. FU in 3-4 months for PVR ? ?Chief Complaint: ?No chief complaint on file. ? ? ?HPI: ?Alan Gill is a 61 y.o. male who presents for continued evaluation of BPH and urethral stricture. Pt underwent scope and balloon dilation of urethral stricture on 06/22/21 and states he is doing well with no c/o of stream issues. He remains on Rapaflo daily. He is no longer taking Uroxatrol. No dysuria, burning, gross hematuria. ? ?IPSS= 11, QOL 2 ?UA clear ?Flo/PVR= ?Max Flo 27ml/sec ?Ave Flow 75mo/sec ?PVR 42ml ? ?06/26/21 ?Alan Gill is a 61yo here for followup after urethral dilation. Voiding trial passed. He denies any pelvic pain. Urine stream was strong after foley removal. No hematuria or dysuria. No other complaints today ? ?Portions of the above documentation were copied from a prior visit for review purposes only. ? ?Allergies: ?No Known Allergies ? ?PMH: ?Past Medical History:  ?Diagnosis Date  ? Carpal tunnel syndrome on both sides   ? GERD (gastroesophageal reflux disease)   ? ? ?PSH: ?Past Surgical History:  ?Procedure Laterality Date  ? COLONOSCOPY N/A 02/20/2016  ? Procedure: COLONOSCOPY;  Surgeon: Daneil Dolin, MD;  [facility];  Service: Endoscopy;  Laterality: N/A;  200  ? CYSTOSCOPY WITH URETHRAL DILATATION N/A 06/22/2021  ? Procedure: CYSTOSCOPY WITH URETHRAL DILATATION;  Surgeon: Cleon Gustin, MD;  Location: [facility];  Service: Urology;  Laterality: N/A;  ? EXTRACORPOREAL SHOCK WAVE LITHOTRIPSY Right 05/24/2020  ? Procedure: EXTRACORPOREAL SHOCK WAVE LITHOTRIPSY (ESWL);  Surgeon: Cleon Gustin, MD;  Location: [facility];  Service: Urology;  Laterality: Right;  ? WRIST SURGERY Bilateral   ? carpal tunnel  ? ? ?SH: ?Social History  ? ?Tobacco Use  ? Smoking status: Never  ? Smokeless tobacco: Never  ?Vaping Use  ? Vaping Use: Never used  ?Substance  Use Topics  ? Alcohol use: No  ? Drug use: No  ? ? ?ROS: ?Constitutional:  Negative for fever, chills, weight loss ?CV: Negative for chest pain ?Respiratory:  Negative for shortness of breath, wheezing, sleep apnea, frequent cough ?GI:  Negative for nausea, vomiting, bloody stool, GERD ? ?PE: ?BP 110/71   Pulse 91  ?GENERAL APPEARANCE:  Well appearing, well developed, well nourished, NAD ?HEENT:  Atraumatic, normocephalic ?NECK:  Supple. Trachea midline ?ABDOMEN:  Soft, non-tender ?EXTREMITIES:  Moves all extremities well, without clubbing, cyanosis, or edema ?NEUROLOGIC:  Alert and oriented x 3, normal gait, CN II-XII grossly intact ?MENTAL STATUS:  appropriate ?SKIN:  Warm, dry, and intact ? ? ?Results: ?Laboratory Data: ?No results found for: WBC, HGB, HCT, MCV, PLT ? ?Lab Results  ?Component Value Date  ? CREATININE 0.57 (L) 06/27/2015  ? ? ?Urinalysis ?   ?Component Value Date/Time  ? APPEARANCEUR Clear 05/31/2021 0935  ? GLUCOSEU Negative 05/31/2021 0935  ? BILIRUBINUR Negative 05/31/2021 0935  ? PROTEINUR Negative 05/31/2021 0935  ? UROBILINOGEN negative 06/10/2013 0956  ? NITRITE Negative 05/31/2021 0935  ? LEUKOCYTESUR Negative 05/31/2021 0935  ? ? ?Lab Results  ?Component Value Date  ? LABMICR Comment 05/31/2021  ? [place] None seen 07/15/2020  ? LABEPIT None seen 07/15/2020  ? MUCUS Present 05/23/2020  ? BACTERIA None seen 07/15/2020  ? ? ?Pertinent Imaging: ? ?Results for orders placed during the hospital encounter of 02/28/21 ? ?Abdomen 1 view (KUB) ? ?Narrative ?CLINICAL DATA:  Kidney stone. ? ?EXAM: ?  ABDOMEN - 1 VIEW ? ?COMPARISON:  Abdominal radiograph dated 07/14/2020. ? ?FINDINGS: ?No bowel dilatation or evidence of obstruction. No free air or ?radiopaque calculi. Degenerative changes of the spine. No acute ?osseous pathology. The soft tissues are unremarkable. ? ?IMPRESSION: ?Negative. ? ? ?Electronically Signed ?By: Anner Crete M.D. ?On: 02/28/2021 21:10 ? ? ?No results found for this or any  previous visit (from the past 24 hour(s)).  ?

## 2021-07-25 LAB — URINALYSIS, ROUTINE W REFLEX MICROSCOPIC
Bilirubin, UA: NEGATIVE
Glucose, UA: NEGATIVE
Ketones, UA: NEGATIVE
Leukocytes,UA: NEGATIVE
Nitrite, UA: NEGATIVE
Protein,UA: NEGATIVE
RBC, UA: NEGATIVE
Specific Gravity, UA: >1.03 — ABNORMAL HIGH (ref 1.005–1.030)
Urobilinogen, Ur: 0.2 mg/dL (ref 0.2–1.0)
pH, UA: 5 (ref 5.0–7.5)

## 2021-10-16 ENCOUNTER — Other Ambulatory Visit: Payer: Self-pay | Admitting: Urology

## 2021-10-27 ENCOUNTER — Ambulatory Visit (INDEPENDENT_AMBULATORY_CARE_PROVIDER_SITE_OTHER): Payer: No Typology Code available for payment source | Admitting: Urology

## 2021-10-27 ENCOUNTER — Encounter: Payer: Self-pay | Admitting: Urology

## 2021-10-27 VITALS — BP 128/81 | HR 81

## 2021-10-27 DIAGNOSIS — N200 Calculus of kidney: Secondary | ICD-10-CM | POA: Diagnosis not present

## 2021-10-27 DIAGNOSIS — N35819 Other urethral stricture, male, unspecified site: Secondary | ICD-10-CM | POA: Diagnosis not present

## 2021-10-27 DIAGNOSIS — N138 Other obstructive and reflux uropathy: Secondary | ICD-10-CM

## 2021-10-27 DIAGNOSIS — N401 Enlarged prostate with lower urinary tract symptoms: Secondary | ICD-10-CM | POA: Diagnosis not present

## 2021-10-27 DIAGNOSIS — R351 Nocturia: Secondary | ICD-10-CM

## 2021-10-27 LAB — URINALYSIS, ROUTINE W REFLEX MICROSCOPIC
Bilirubin, UA: NEGATIVE
Glucose, UA: NEGATIVE
Ketones, UA: NEGATIVE
Leukocytes,UA: NEGATIVE
Nitrite, UA: NEGATIVE
Protein,UA: NEGATIVE
RBC, UA: NEGATIVE
Specific Gravity, UA: 1.025 (ref 1.005–1.030)
Urobilinogen, Ur: 0.2 mg/dL (ref 0.2–1.0)
pH, UA: 5 (ref 5.0–7.5)

## 2021-10-27 MED ORDER — TAMSULOSIN HCL 0.4 MG PO CAPS: 0.4000 mg | ORAL_CAPSULE | Freq: Every day | ORAL | 3 refills | Status: AC

## 2021-10-27 NOTE — Progress Notes (Signed)
10/27/2021 9:04 AM   Alan Gill 29-Apr-1960 [id number]  Referring provider: Toma Deiters, MD [address] [place],  [place] [address]  Followup BPH and urethral stricture   HPI: Alan Gill is a 61yo here for followup for BPH with urethral stricture. IPSS 9 QOL 3 on rapaflo 8mg . Urine stream strong. Nocturia 1-2x. No dysuria. No other complaints.   PMH: Past Medical History:  Diagnosis Date   Carpal tunnel syndrome on both sides    GERD (gastroesophageal reflux disease)     Surgical History: Past Surgical History:  Procedure Laterality Date   COLONOSCOPY N/A 02/20/2016   Procedure: COLONOSCOPY;  Surgeon: 02/22/2016, MD;  [facility];  Service: Endoscopy;  Laterality: N/A;  200   CYSTOSCOPY WITH URETHRAL DILATATION N/A 06/22/2021   Procedure: CYSTOSCOPY WITH URETHRAL DILATATION;  Surgeon: 06/24/2021, MD;  Location: [facility];  Service: Urology;  Laterality: N/A;   EXTRACORPOREAL SHOCK WAVE LITHOTRIPSY Right 05/24/2020   Procedure: EXTRACORPOREAL SHOCK WAVE LITHOTRIPSY (ESWL);  Surgeon: 05/26/2020, MD;  Location: [facility];  Service: Urology;  Laterality: Right;   WRIST SURGERY Bilateral    carpal tunnel    Home Medications:  Allergies as of 10/27/2021   No Known Allergies      Medication List        Accurate as of October 27, 2021  9:04 AM. If you have any questions, ask your nurse or doctor.          alfuzosin 10 MG 24 hr tablet Commonly known as: UROXATRAL Take 1 tablet (10 mg total) by mouth daily with breakfast.   clotrimazole-betamethasone cream Commonly known as: Lotrisone Apply 1 application. topically 2 (two) times daily.   HYDROcodone-acetaminophen 5-325 MG tablet Commonly known as: Norco Take 1 tablet by mouth every 6 (six) hours as needed for moderate pain.   hydrOXYzine 10 MG tablet Commonly known as: ATARAX TAKE 1 TABLET BY MOUTH AT BEDTIME   magnesium oxide 400 MG tablet Commonly known as: MAG-OX Take 400 mg by  mouth daily.   mirabegron ER 25 MG Tb24 tablet Commonly known as: MYRBETRIQ Take 1 tablet (25 mg total) by mouth daily.   mirabegron ER 25 MG Tb24 tablet Commonly known as: MYRBETRIQ Take 1 tablet (25 mg total) by mouth daily.   multivitamin with minerals tablet Take 1 tablet by mouth daily.   SUPER B COMPLEX PO Take 1 tablet by mouth daily.   tadalafil 5 MG tablet Commonly known as: CIALIS Take 5 mg by mouth daily as needed for erectile dysfunction.   tamsulosin 0.4 MG Caps capsule Commonly known as: FLOMAX Take 1 capsule (0.4 mg total) by mouth daily.   vitamin B-12 1000 MCG tablet Commonly known as: CYANOCOBALAMIN Take 1,000 mcg by mouth daily.   vitamin C 500 MG tablet Commonly known as: ASCORBIC ACID Take 500 mg by mouth daily.   Vitamin D3 10 MCG (400 UNIT) tablet Take 400 Units by mouth daily.   vitamin E 180 MG (400 UNITS) capsule Take 400 Units by mouth daily.   zinc gluconate 50 MG tablet Take 50 mg by mouth daily.        Allergies: No Known Allergies  Family History: Family History  Problem Relation Age of Onset   Healthy Mother    Liver cancer Mother    Healthy Father    Bladder Cancer Father    Healthy Sister    Cancer Maternal Grandmother  breast   Early death Sister    Drug abuse Son    Bladder Cancer Paternal Uncle    Colon cancer Neg Hx     Social History:  reports that he has never smoked. He has never used smokeless tobacco. He reports that he does not drink alcohol and does not use drugs.  ROS: All other review of systems were reviewed and are negative except what is noted above in HPI  Physical Exam: There were no vitals taken for this visit.  Constitutional:  Alert and oriented, No acute distress. HEENT: [place] AT, moist mucus membranes.  Trachea midline, no masses. Cardiovascular: No clubbing, cyanosis, or edema. Respiratory: Normal respiratory effort, no increased work of breathing. GI: Abdomen is soft, nontender,  nondistended, no abdominal masses GU: No CVA tenderness.  Lymph: No cervical or inguinal lymphadenopathy. Skin: No rashes, bruises or suspicious lesions. Neurologic: Grossly intact, no focal deficits, moving all 4 extremities. Psychiatric: Normal mood and affect.  Laboratory Data: No results found for: "WBC", "HGB", "HCT", "MCV", "PLT"  Lab Results  Component Value Date   CREATININE 0.57 (L) 06/27/2015    No results found for: "PSA"  No results found for: "TESTOSTERONE"  No results found for: "HGBA1C"  Urinalysis    Component Value Date/Time   APPEARANCEUR Clear 07/24/2021 0918   GLUCOSEU Negative 07/24/2021 0918   BILIRUBINUR Negative 07/24/2021 0918   PROTEINUR Negative 07/24/2021 0918   UROBILINOGEN negative 06/10/2013 0956   NITRITE Negative 07/24/2021 0918   LEUKOCYTESUR Negative 07/24/2021 0918    Lab Results  Component Value Date   LABMICR Comment 07/24/2021   WBCUA None seen 07/15/2020   LABEPIT None seen 07/15/2020   MUCUS Present 05/23/2020   BACTERIA None seen 07/15/2020    Pertinent Imaging:  Results for orders placed during the hospital encounter of 02/28/21  Abdomen 1 view (KUB)  Narrative CLINICAL DATA:  Kidney stone.  EXAM: ABDOMEN - 1 VIEW  COMPARISON:  Abdominal radiograph dated 07/14/2020.  FINDINGS: No bowel dilatation or evidence of obstruction. No free air or radiopaque calculi. Degenerative changes of the spine. No acute osseous pathology. The soft tissues are unremarkable.  IMPRESSION: Negative.   Electronically Signed By: Elgie Collard M.D. On: 02/28/2021 21:10  No results found for this or any previous visit.  No results found for this or any previous visit.  No results found for this or any previous visit.  No results found for this or any previous visit.  No results found for this or any previous visit.  No results found for this or any previous visit.  No results found for this or any previous  visit.   Assessment & Plan:    1. Benign prostatic hyperplasia with urinary obstruction -Continue flomax 0.4mg  daily - BLADDER SCAN AMB NON-IMAGING - Urinalysis, Routine w reflex microscopic  2. Nocturia Flomax 0.4mg  daily  3. nephrolithiasis -RTC 6 months with KUb   No follow-ups on file.  Wilkie Aye, MD  [facility] [place]

## 2021-10-27 NOTE — Progress Notes (Signed)
post void residual=1 

## 2021-10-27 NOTE — Patient Instructions (Signed)

## 2022-04-27 ENCOUNTER — Ambulatory Visit: Payer: No Typology Code available for payment source | Admitting: Urology

## 2022-04-27 ENCOUNTER — Encounter: Payer: Self-pay | Admitting: Urology

## 2022-04-27 ENCOUNTER — Ambulatory Visit (HOSPITAL_COMMUNITY)
Admission: RE | Admit: 2022-04-27 | Discharge: 2022-04-27 | Disposition: A | Discharge status: Home / Self Care | Payer: No Typology Code available for payment source | Source: Ambulatory Visit | Attending: Urology | Admitting: Urology

## 2022-04-27 DIAGNOSIS — N401 Enlarged prostate with lower urinary tract symptoms: Secondary | ICD-10-CM | POA: Diagnosis not present

## 2022-04-27 DIAGNOSIS — N5201 Erectile dysfunction due to arterial insufficiency: Secondary | ICD-10-CM

## 2022-04-27 DIAGNOSIS — R351 Nocturia: Secondary | ICD-10-CM

## 2022-04-27 DIAGNOSIS — N200 Calculus of kidney: Secondary | ICD-10-CM

## 2022-04-27 DIAGNOSIS — N138 Other obstructive and reflux uropathy: Secondary | ICD-10-CM

## 2022-04-27 DIAGNOSIS — Z87442 Personal history of urinary calculi: Secondary | ICD-10-CM

## 2022-04-27 LAB — URINALYSIS, ROUTINE W REFLEX MICROSCOPIC
Bilirubin, UA: NEGATIVE
Glucose, UA: NEGATIVE
Ketones, UA: NEGATIVE
Leukocytes,UA: NEGATIVE
Nitrite, UA: NEGATIVE
Protein,UA: NEGATIVE
RBC, UA: NEGATIVE
Specific Gravity, UA: 1.02 (ref 1.005–1.030)
Urobilinogen, Ur: 0.2 mg/dL (ref 0.2–1.0)
pH, UA: 5.5 (ref 5.0–7.5)

## 2022-04-27 MED ORDER — GEMTESA 75 MG PO TABS: 1.0000 | ORAL_TABLET | Freq: Every day | ORAL | 0 refills | Status: AC

## 2022-04-27 NOTE — Patient Instructions (Signed)

## 2022-04-27 NOTE — Progress Notes (Unsigned)
04/27/2022 10:46 AM   Alan Gill February 05, 1961 [id number]  Referring provider: Neale Burly, MD [place],  [place] [id number]  Nocturia and nephrolithiasis.    HPI: Mr Alan Gill is a 62yo here for followup for BPH and nephrolithiasis No stone events since last visit. KUb shows no calculi. IPSS 16 QOL 3 on uroxatral 10mg  daily. He has nocturia 3-4x. Mirabegron did not improve his nocturiaUirne stream strong  PMH: Past Medical History:  Diagnosis Date   Carpal tunnel syndrome on both sides    GERD (gastroesophageal reflux disease)     Surgical History: Past Surgical History:  Procedure Laterality Date   COLONOSCOPY N/A 02/20/2016   Procedure: COLONOSCOPY;  Surgeon: Alan Dolin, MD;  [facility];  Service: Endoscopy;  Laterality: N/A;  200   CYSTOSCOPY WITH URETHRAL DILATATION N/A 06/22/2021   Procedure: CYSTOSCOPY WITH URETHRAL DILATATION;  Surgeon: Alan Gustin, MD;  Location: [facility];  Service: Urology;  Laterality: N/A;   EXTRACORPOREAL SHOCK WAVE LITHOTRIPSY Right 05/24/2020   Procedure: EXTRACORPOREAL SHOCK WAVE LITHOTRIPSY (ESWL);  Surgeon: Alan Gustin, MD;  Location: [facility];  Service: Urology;  Laterality: Right;   WRIST SURGERY Bilateral    carpal tunnel    Home Medications:  Allergies as of 04/27/2022   No Known Allergies      Medication List        Accurate as of April 27, 2022 10:46 AM. If you have any questions, ask your nurse or doctor.          alfuzosin 10 MG 24 hr tablet Commonly known as: UROXATRAL Take 1 tablet (10 mg total) by mouth daily with breakfast.   ascorbic acid 500 MG tablet Commonly known as: VITAMIN C Take 500 mg by mouth daily.   clotrimazole-betamethasone cream Commonly known as: Lotrisone Apply 1 application. topically 2 (two) times daily.   cyanocobalamin 1000 MCG tablet Commonly known as: VITAMIN B12 Take 1,000 mcg by mouth daily.   HYDROcodone-acetaminophen 5-325 MG  tablet Commonly known as: Norco Take 1 tablet by mouth every 6 (six) hours as needed for moderate pain.   hydrOXYzine 10 MG tablet Commonly known as: ATARAX TAKE 1 TABLET BY MOUTH AT BEDTIME   magnesium oxide 400 MG tablet Commonly known as: MAG-OX Take 400 mg by mouth daily.   mirabegron ER 25 MG Tb24 tablet Commonly known as: MYRBETRIQ Take 1 tablet (25 mg total) by mouth daily.   mirabegron ER 25 MG Tb24 tablet Commonly known as: MYRBETRIQ Take 1 tablet (25 mg total) by mouth daily.   multivitamin with minerals tablet Take 1 tablet by mouth daily.   SUPER B COMPLEX PO Take 1 tablet by mouth daily.   tadalafil 5 MG tablet Commonly known as: CIALIS Take 5 mg by mouth daily as needed for erectile dysfunction.   tamsulosin 0.4 MG Caps capsule Commonly known as: FLOMAX Take 1 capsule (0.4 mg total) by mouth daily after supper.   Vitamin D3 10 MCG (400 UNIT) tablet Take 400 Units by mouth daily.   vitamin E 180 MG (400 UNITS) capsule Take 400 Units by mouth daily.   zinc gluconate 50 MG tablet Take 50 mg by mouth daily.        Allergies: No Known Allergies  Family History: Family History  Problem Relation Age of Onset   Healthy Mother    Liver cancer Mother    Healthy Father    Bladder Cancer Father    Healthy Sister  Cancer Maternal Grandmother        breast   Early death Sister    Drug abuse Son    Bladder Cancer Paternal Uncle    Colon cancer Neg Hx     Social History:  reports that he has never smoked. He has never used smokeless tobacco. He reports that he does not drink alcohol and does not use drugs.  ROS: All other review of systems were reviewed and are negative except what is noted above in HPI  Physical Exam: There were no vitals taken for this visit.  Constitutional:  Alert and oriented, No acute distress. HEENT: [place] AT, moist mucus membranes.  Trachea midline, no masses. Cardiovascular: No clubbing, cyanosis, or edema. Respiratory:  Normal respiratory effort, no increased work of breathing. GI: Abdomen is soft, nontender, nondistended, no abdominal masses GU: No CVA tenderness.  Lymph: No cervical or inguinal lymphadenopathy. Skin: No rashes, bruises or suspicious lesions. Neurologic: Grossly intact, no focal deficits, moving all 4 extremities. Psychiatric: Normal mood and affect.  Laboratory Data: No results found for: "WBC", "HGB", "HCT", "MCV", "PLT"  Lab Results  Component Value Date   CREATININE 0.57 (L) 06/27/2015    No results found for: "PSA"  No results found for: "TESTOSTERONE"  No results found for: "HGBA1C"  Urinalysis    Component Value Date/Time   APPEARANCEUR Clear 10/27/2021 0912   GLUCOSEU Negative 10/27/2021 0912   BILIRUBINUR Negative 10/27/2021 0912   PROTEINUR Negative 10/27/2021 0912   UROBILINOGEN negative 06/10/2013 0956   NITRITE Negative 10/27/2021 0912   LEUKOCYTESUR Negative 10/27/2021 0912    Lab Results  Component Value Date   LABMICR Comment 10/27/2021   WBCUA None seen 07/15/2020   LABEPIT None seen 07/15/2020   MUCUS Present 05/23/2020   BACTERIA None seen 07/15/2020    Pertinent Imaging: *** Results for orders placed during the hospital encounter of 02/28/21  Abdomen 1 view (KUB)  Narrative CLINICAL DATA:  Kidney stone.  EXAM: ABDOMEN - 1 VIEW  COMPARISON:  Abdominal radiograph dated 07/14/2020.  FINDINGS: No bowel dilatation or evidence of obstruction. No free air or radiopaque calculi. Degenerative changes of the spine. No acute osseous pathology. The soft tissues are unremarkable.  IMPRESSION: Negative.   Electronically Signed By: Alan Gill M.D. On: 02/28/2021 21:10  No results found for this or any previous visit.  No results found for this or any previous visit.  No results found for this or any previous visit.  No results found for this or any previous visit.  No valid procedures specified. No results found for this or  any previous visit.  No results found for this or any previous visit.   Assessment & Plan:    1. Nephrolithiasis ***  2. Benign prostatic hyperplasia with urinary obstruction ***  3. Nocturia ***  4. Erectile dysfunction -tadalafil   No follow-ups on file.  Alan Bang, MD  [facility] [place]

## 2022-05-29 ENCOUNTER — Telehealth: Payer: Self-pay

## 2022-05-29 NOTE — Telephone Encounter (Signed)
Wanting refills called in of Vibegron (GEMTESA) 75 MG TABS   Rx is helping with overnight symptoms.  Call into [place], [place] Phone: [phone]  Fax: [phone]

## 2022-05-30 MED ORDER — GEMTESA 75 MG PO TABS: 75.0000 mg | ORAL_TABLET | Freq: Every day | ORAL | 5 refills | Status: AC

## 2022-05-30 NOTE — Telephone Encounter (Signed)
Left vm making patient aware rx was sent to [facility]

## 2022-06-01 ENCOUNTER — Telehealth: Payer: Self-pay

## 2022-06-01 NOTE — Telephone Encounter (Signed)
Patient states he cannot afford to continue to take Gemtesa rx. Insurance requires patient to try and fail 2 alternatives before they will approve coverage.  Patient is asking if you have any other recommendations.  I did leave a vm for patient informing him that we could provide samples until then. Samples left at front desk.

## 2022-06-05 NOTE — Telephone Encounter (Signed)
Patient will pick up samples by this Friday 03/02.

## 2022-06-05 NOTE — Telephone Encounter (Signed)
Left voicemail for patient to return call and confirm samples.  Samples left at front desk for pt.

## 2022-10-22 ENCOUNTER — Telehealth: Payer: Self-pay

## 2022-10-22 NOTE — Telephone Encounter (Signed)
Patient is asking if he will need to have Xray done prior to this Friday appointment?  Please advise.

## 2022-10-22 NOTE — Telephone Encounter (Signed)
Patient has a 6 month follow up with Dr. Ronne Binning  July 19. Per Dr. Ronne Binning last office note in January  patient was supposed to follow up in a year with KUB. Please advised

## 2022-10-23 NOTE — Telephone Encounter (Signed)
Patient is made aware of Dr. Ronne Binning recommendation to follow up "1 year" Patient voiced understanding.

## 2022-10-26 ENCOUNTER — Ambulatory Visit: Payer: No Typology Code available for payment source | Admitting: Urology

## 2022-12-16 IMAGING — DX DG ABDOMEN 1V
2 series · 2 of 2 positions shown · non-contrast
Comparison: 05/24/2020.

CLINICAL DATA: Post ESWL.

EXAM:
ABDOMEN - 1 VIEW

[abdomen kub (1 of 2)]
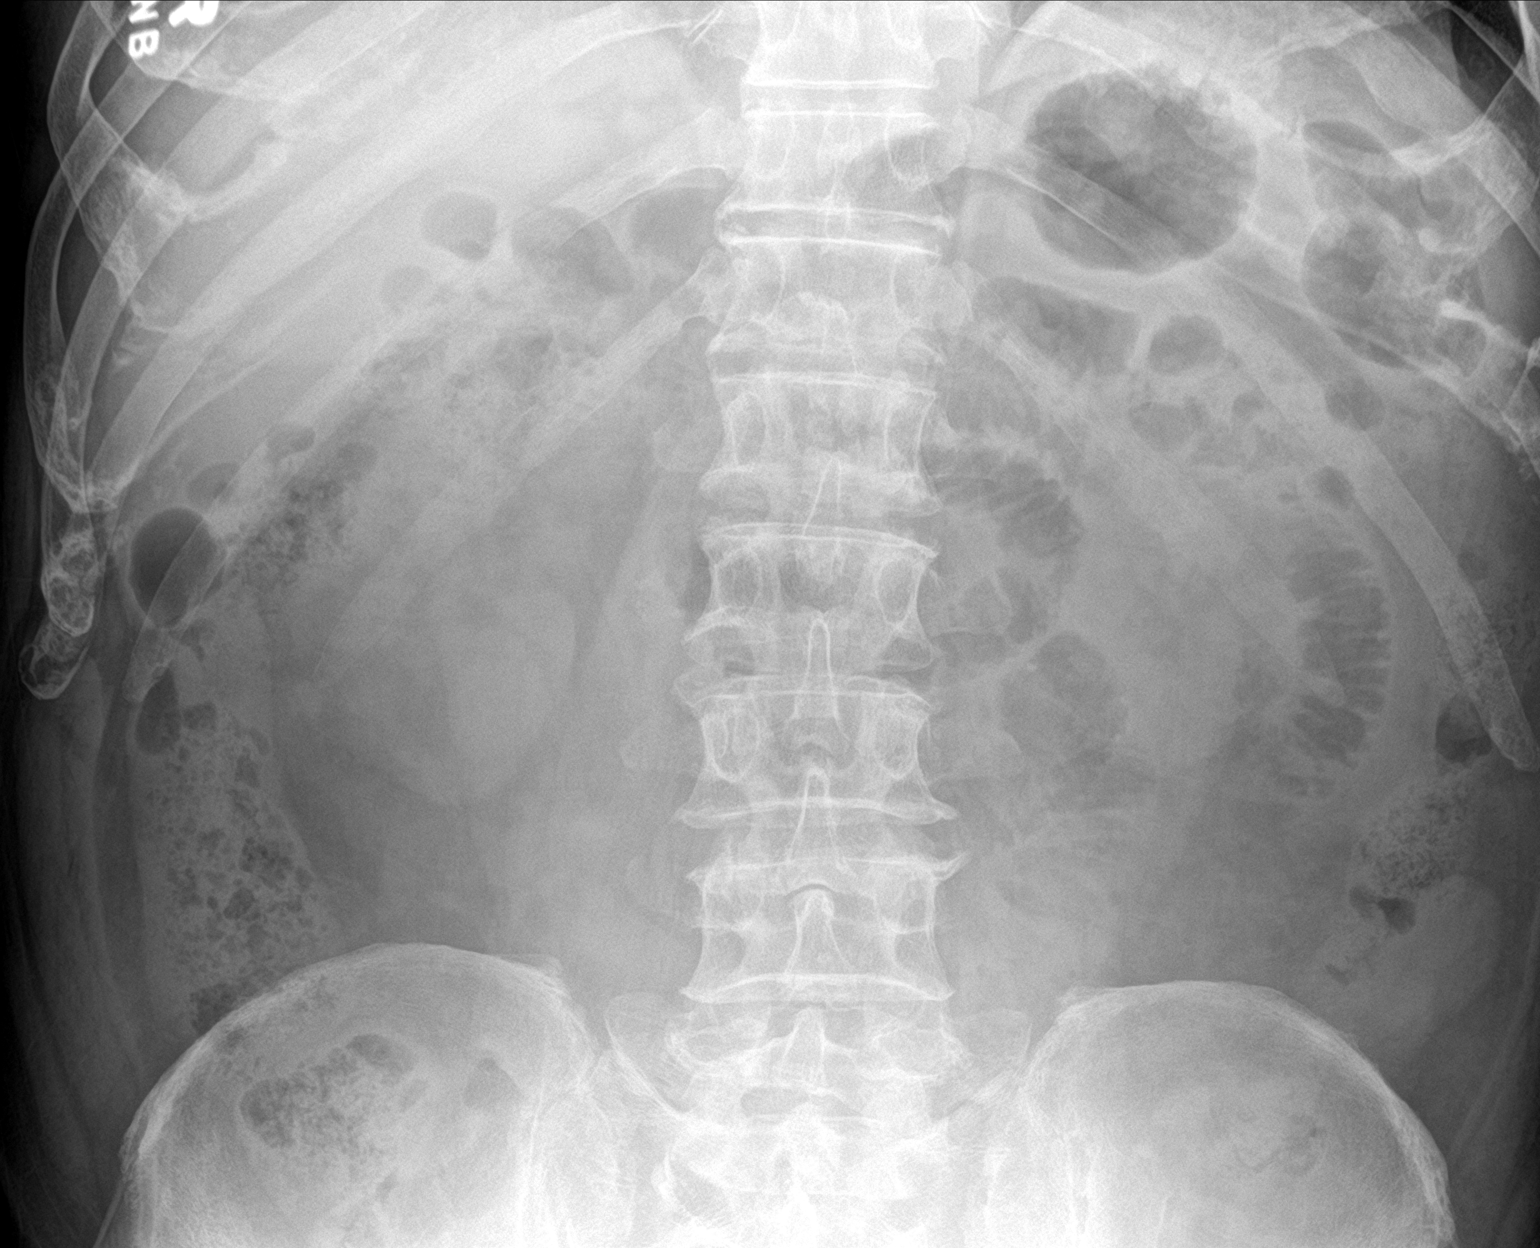

[abdomen kub (2 of 2)]
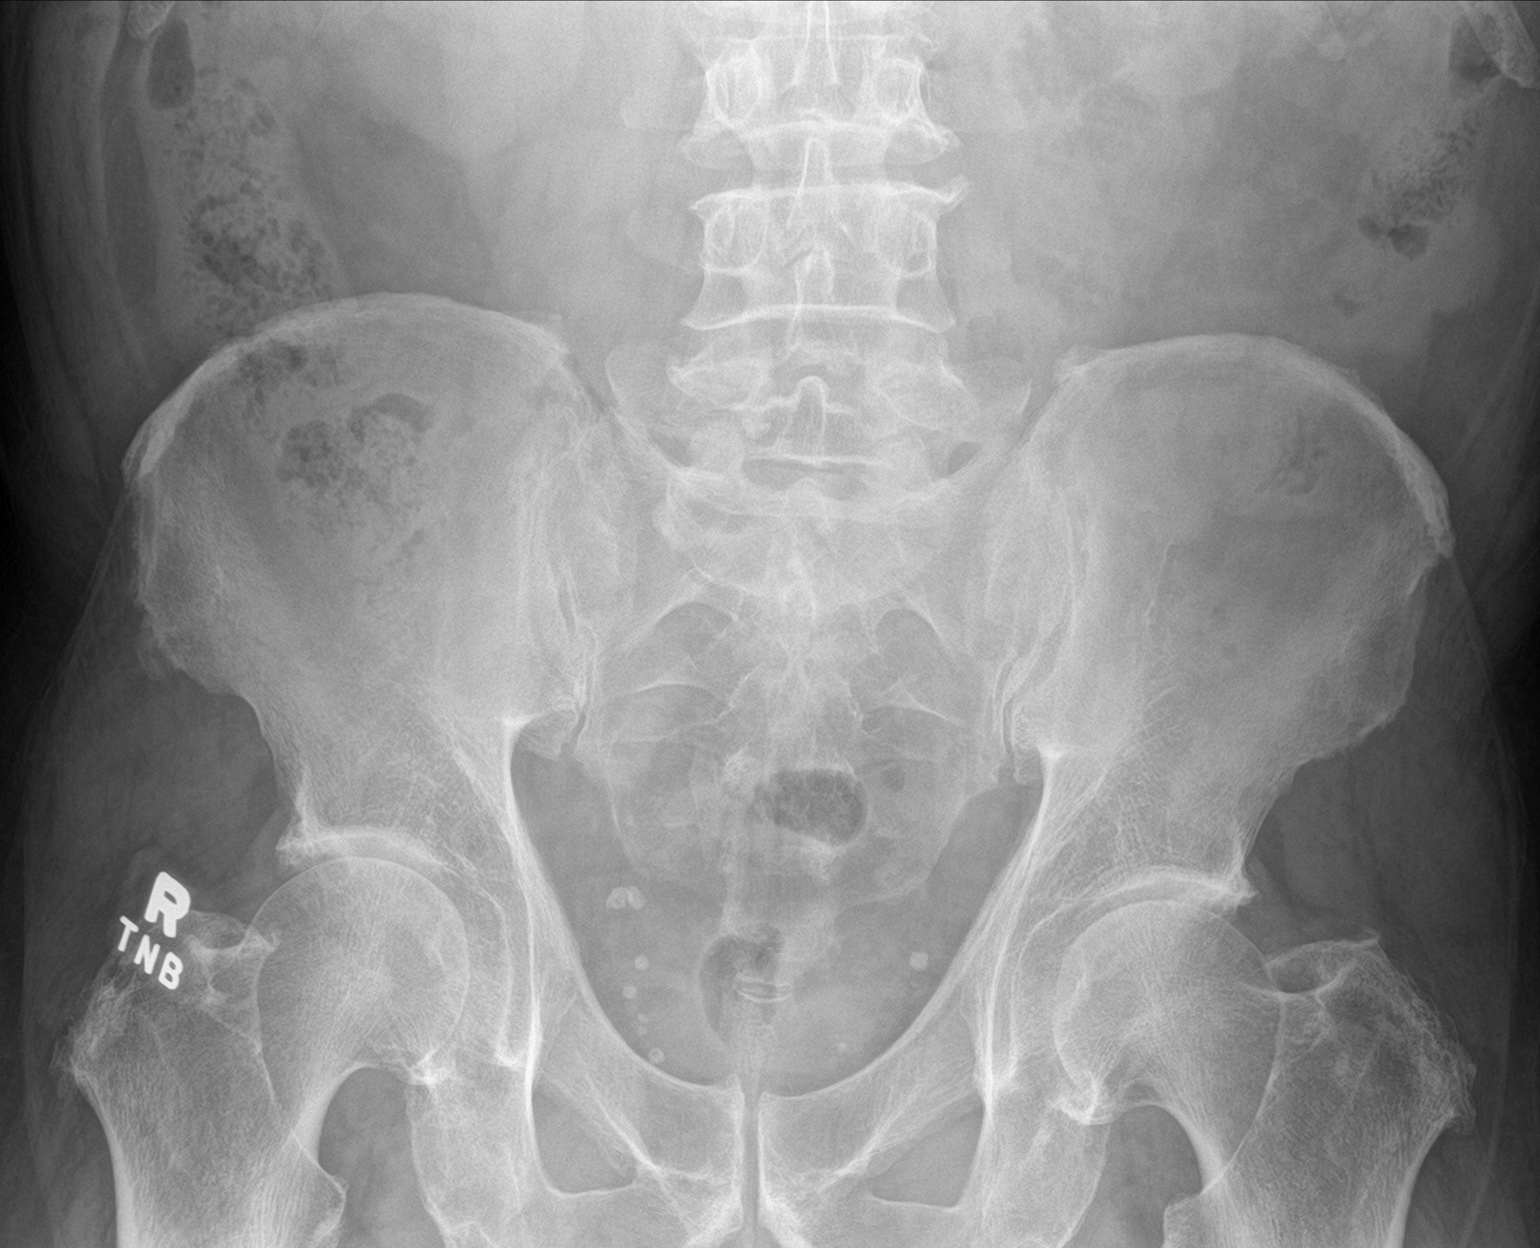

[2 of 2 positions shown; findings below may reference images not displayed]

FINDINGS: Soft tissue structures are unremarkable. Punctate 2 mm calcific
density over the left upper flank cannot be excluded. Tiny proximal
left ureteral stone cannot be excluded. Stable pelvic calcifications
most consistent with phleboliths. Distal ureteral stones cannot be
entirely excluded. No bowel distention. Degenerative change lumbar
spine and both hips.
IMPRESSION: Punctate 2 mm calcific density over the left upper flank cannot be
excluded. Tiny proximal left ureteral stone cannot be excluded. Exam
otherwise unremarkable.

## 2023-05-01 ENCOUNTER — Ambulatory Visit: Payer: No Typology Code available for payment source | Admitting: Urology

## 2023-08-26 ENCOUNTER — Other Ambulatory Visit: Payer: Self-pay

## 2023-08-26 ENCOUNTER — Ambulatory Visit (HOSPITAL_COMMUNITY)
Admission: RE | Admit: 2023-08-26 | Discharge: 2023-08-26 | Disposition: A | Discharge status: Home / Self Care | Source: Ambulatory Visit | Attending: Urology | Admitting: Urology

## 2023-08-26 DIAGNOSIS — N200 Calculus of kidney: Secondary | ICD-10-CM | POA: Diagnosis present

## 2023-08-28 ENCOUNTER — Ambulatory Visit: Payer: Self-pay | Admitting: Urology

## 2023-08-28 ENCOUNTER — Encounter: Payer: Self-pay | Admitting: Urology

## 2023-08-28 VITALS — BP 119/70 | HR 71

## 2023-08-28 DIAGNOSIS — N138 Other obstructive and reflux uropathy: Secondary | ICD-10-CM

## 2023-08-28 DIAGNOSIS — R351 Nocturia: Secondary | ICD-10-CM

## 2023-08-28 DIAGNOSIS — N401 Enlarged prostate with lower urinary tract symptoms: Secondary | ICD-10-CM

## 2023-08-28 DIAGNOSIS — N200 Calculus of kidney: Secondary | ICD-10-CM

## 2023-08-28 DIAGNOSIS — Z87442 Personal history of urinary calculi: Secondary | ICD-10-CM | POA: Diagnosis not present

## 2023-08-28 LAB — URINALYSIS, ROUTINE W REFLEX MICROSCOPIC
Bilirubin, UA: NEGATIVE
Glucose, UA: NEGATIVE
Ketones, UA: NEGATIVE
Leukocytes,UA: NEGATIVE
Nitrite, UA: NEGATIVE
Protein,UA: NEGATIVE
RBC, UA: NEGATIVE
Specific Gravity, UA: 1.03 (ref 1.005–1.030)
Urobilinogen, Ur: 0.2 mg/dL (ref 0.2–1.0)
pH, UA: 5.5 (ref 5.0–7.5)

## 2023-08-28 NOTE — Patient Instructions (Signed)

## 2023-08-28 NOTE — Progress Notes (Signed)
 08/28/2023 8:14 AM   Alan Gill December 30, 1960 [id number]  Referring provider: Veda Gerald, MD [address] [place],  [place] [address]  Followup nephrolithiasis   HPI: Mr Alan Gill is a 63yo here for followup for nocturia and nephrolithiasis. No stone events since last visit. No flank pain. KUB 5/19 shows no calculi. IPSS 12 QOL 2 off uroxatral  10mg . He has nocturia 1-2x depending on fluid consumption. Urine stream strong. He has urinary frequency every 2-3 hours. No straining to urinate   PMH: Past Medical History:  Diagnosis Date   Carpal tunnel syndrome on both sides    GERD (gastroesophageal reflux disease)     Surgical History: Past Surgical History:  Procedure Laterality Date   COLONOSCOPY N/A 02/20/2016   Procedure: COLONOSCOPY;  Surgeon: Suzette Espy, MD;  Location: [facility];  Service: Endoscopy;  Laterality: N/A;  200   CYSTOSCOPY WITH URETHRAL DILATATION N/A 06/22/2021   Procedure: CYSTOSCOPY WITH URETHRAL DILATATION;  Surgeon: Alan Severs, MD;  Location: [facility];  Service: Urology;  Laterality: N/A;   EXTRACORPOREAL SHOCK WAVE LITHOTRIPSY Right 05/24/2020   Procedure: EXTRACORPOREAL SHOCK WAVE LITHOTRIPSY (ESWL);  Surgeon: Alan Severs, MD;  Location: [facility];  Service: Urology;  Laterality: Right;   WRIST SURGERY Bilateral    carpal tunnel    Home Medications:  Allergies as of 08/28/2023   No Known Allergies      Medication List        Accurate as of Aug 28, 2023  8:14 AM. If you have any questions, ask your nurse or doctor.          alfuzosin  10 MG 24 hr tablet Commonly known as: UROXATRAL  Take 1 tablet (10 mg total) by mouth daily with breakfast.   ascorbic acid 500 MG tablet Commonly known as: VITAMIN C Take 500 mg by mouth daily.   clotrimazole -betamethasone  cream Commonly known as: Lotrisone  Apply 1 application. topically 2 (two) times daily.   cyanocobalamin 1000 MCG tablet Commonly known as: VITAMIN B12 Take 1,000  mcg by mouth daily.   Gemtesa  75 MG Tabs Generic drug: Vibegron  Take 1 tablet (75 mg total) by mouth daily.   Gemtesa  75 MG Tabs Generic drug: Vibegron  Take 1 tablet (75 mg total) by mouth daily.   HYDROcodone -acetaminophen  5-325 MG tablet Commonly known as: Norco Take 1 tablet by mouth every 6 (six) hours as needed for moderate pain.   hydrOXYzine  10 MG tablet Commonly known as: ATARAX  TAKE 1 TABLET BY MOUTH AT BEDTIME   magnesium oxide 400 MG tablet Commonly known as: MAG-OX Take 400 mg by mouth daily.   mirabegron  ER 25 MG Tb24 tablet Commonly known as: MYRBETRIQ  Take 1 tablet (25 mg total) by mouth daily.   mirabegron  ER 25 MG Tb24 tablet Commonly known as: MYRBETRIQ  Take 1 tablet (25 mg total) by mouth daily.   multivitamin with minerals tablet Take 1 tablet by mouth daily.   olmesartan 40 MG tablet Commonly known as: BENICAR Take 40 mg by mouth daily.   SUPER B COMPLEX PO Take 1 tablet by mouth daily.   tadalafil 5 MG tablet Commonly known as: CIALIS Take 5 mg by mouth daily as needed for erectile dysfunction.   tamsulosin  0.4 MG Caps capsule Commonly known as: FLOMAX  Take 1 capsule (0.4 mg total) by mouth daily after supper.   Vitamin D3 10 MCG (400 UNIT) tablet Take 400 Units by mouth daily.   vitamin E 180 MG (400 UNITS) capsule Take 400 Units  by mouth daily.   zinc gluconate 50 MG tablet Take 50 mg by mouth daily.        Allergies: No Known Allergies  Family History: Family History  Problem Relation Age of Onset   Healthy Mother    Liver cancer Mother    Healthy Father    Bladder Cancer Father    Healthy Sister    Cancer Maternal Grandmother        breast   Early death Sister    Drug abuse Son    Bladder Cancer Paternal Uncle    Colon cancer Neg Hx     Social History:  reports that he has never smoked. He has never used smokeless tobacco. He reports that he does not drink alcohol and does not use drugs.  ROS: All other review  of systems were reviewed and are negative except what is noted above in HPI  Physical Exam: BP 119/70   Pulse 71   Constitutional:  Alert and oriented, No acute distress. HEENT: [place] AT, moist mucus membranes.  Trachea midline, no masses. Cardiovascular: No clubbing, cyanosis, or edema. Respiratory: Normal respiratory effort, no increased work of breathing. GI: Abdomen is soft, nontender, nondistended, no abdominal masses GU: No CVA tenderness.  Lymph: No cervical or inguinal lymphadenopathy. Skin: No rashes, bruises or suspicious lesions. Neurologic: Grossly intact, no focal deficits, moving all 4 extremities. Psychiatric: Normal mood and affect.  Laboratory Data: No results found for: "WBC", "HGB", "HCT", "MCV", "PLT"  Lab Results  Component Value Date   CREATININE 0.57 (L) 06/27/2015    No results found for: "PSA"  No results found for: "TESTOSTERONE"  No results found for: "HGBA1C"  Urinalysis    Component Value Date/Time   APPEARANCEUR Clear 04/27/2022 1118   GLUCOSEU Negative 04/27/2022 1118   BILIRUBINUR Negative 04/27/2022 1118   PROTEINUR Negative 04/27/2022 1118   UROBILINOGEN negative 06/10/2013 0956   NITRITE Negative 04/27/2022 1118   LEUKOCYTESUR Negative 04/27/2022 1118    Lab Results  Component Value Date   LABMICR Comment 04/27/2022   WBCUA None seen 07/15/2020   LABEPIT None seen 07/15/2020   MUCUS Present 05/23/2020   BACTERIA None seen 07/15/2020    Pertinent Imaging: KUb 08/26/23: Images reviewed and discussed with the patient  Results for orders placed during the hospital encounter of 04/27/22  Abdomen 1 view (KUB)  Narrative CLINICAL DATA:  Nephrolithiasis  EXAM: ABDOMEN - 1 VIEW  COMPARISON:  02/26/2021  FINDINGS: Scattered pelvic phleboliths bilaterally.  No urinary tract calcification.  Bowel gas pattern normal.  Mild degenerative disc disease changes of thoracolumbar spine.  IMPRESSION: No urinary tract  calcifications.   Electronically Signed By: Wynelle Heather M.D. On: 04/28/2022 19:56  No results found for this or any previous visit.  No results found for this or any previous visit.  No results found for this or any previous visit.  No results found for this or any previous visit.  No results found for this or any previous visit.  No results found for this or any previous visit.  No results found for this or any previous visit.   Assessment & Plan:    1. Nephrolithiasis (Primary) Followup 1 year with KUB - Urinalysis, Routine w reflex microscopic  2. Benign prostatic hyperplasia with urinary obstruction -patient defers therapy at this time  3. Nocturia -decrease fluid intake within 2 hours of going to bed   No follow-ups on file.  Johnie Nailer, MD  [facility] [place]

## 2023-08-31 IMAGING — CR DG ABDOMEN 1V
2 series · 2 of 2 positions shown · non-contrast
Comparison: Abdominal radiograph dated 07/14/2020.

CLINICAL DATA: Kidney stone.

EXAM:
ABDOMEN - 1 VIEW

[t ap supine (1 of 2)]
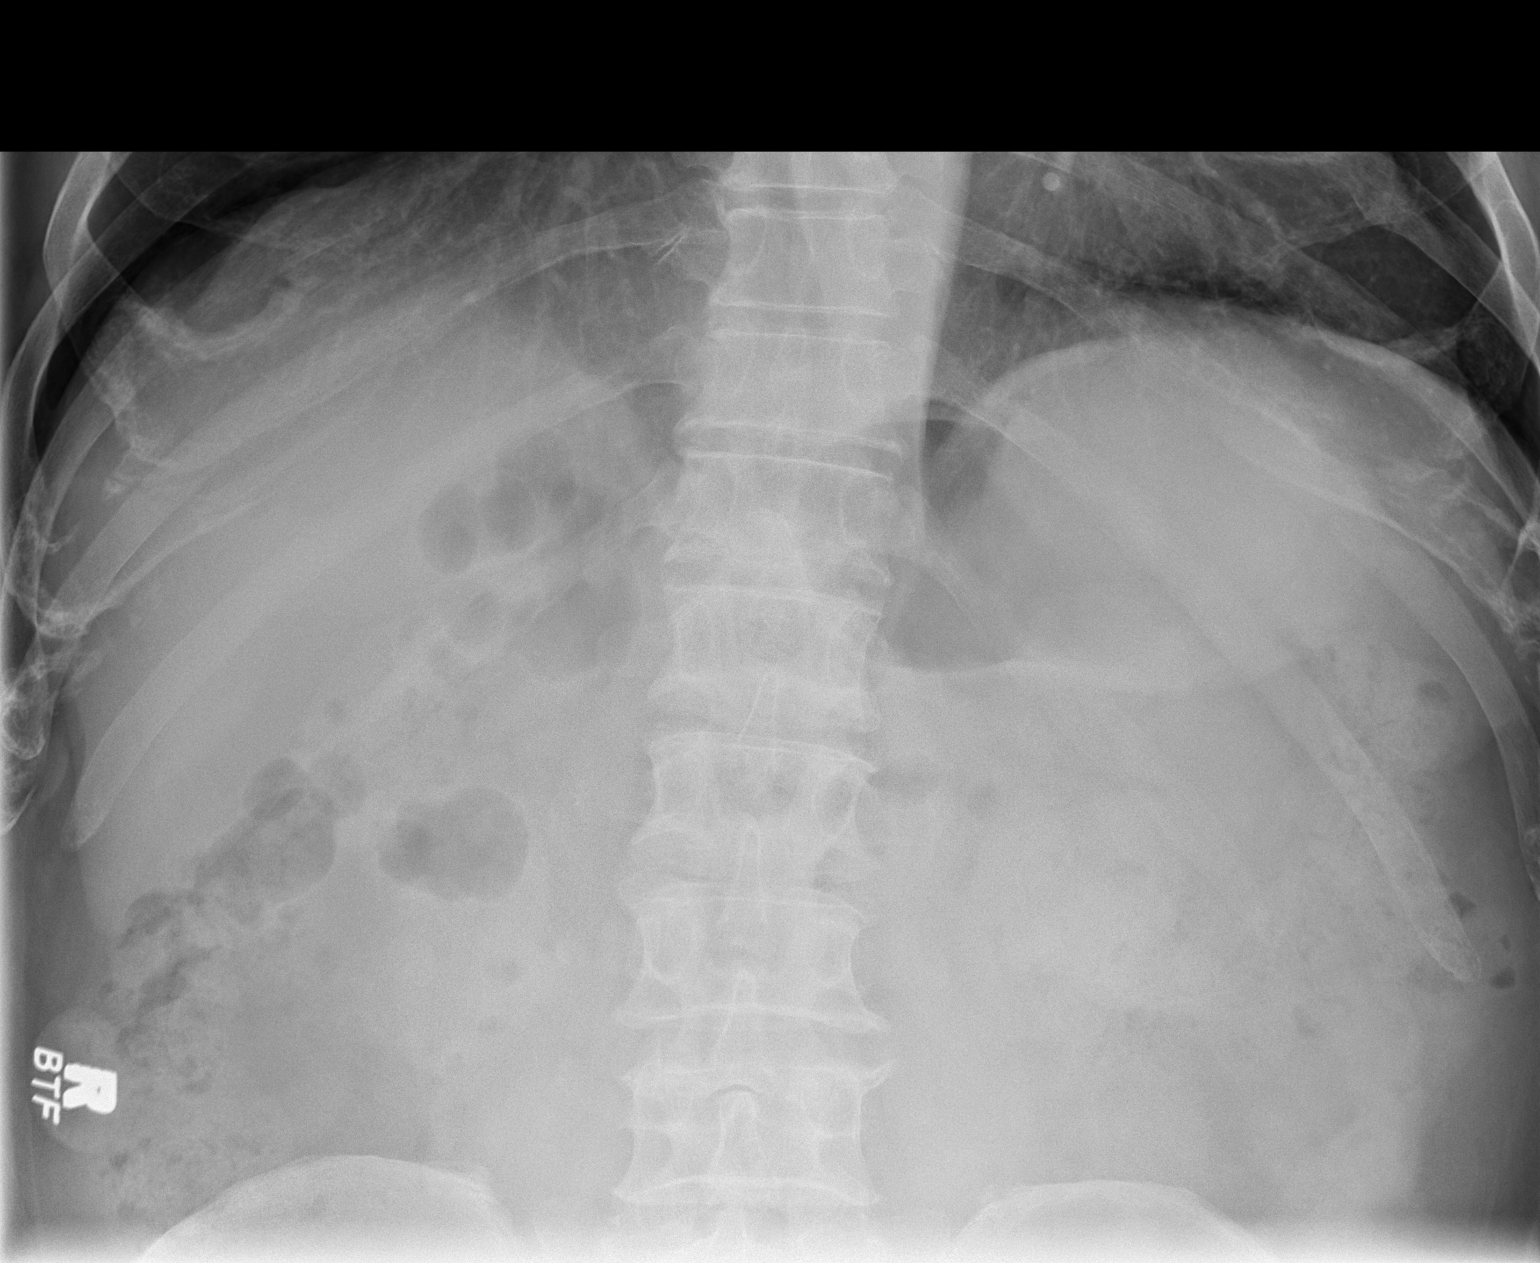

[t ap supine (2 of 2)]
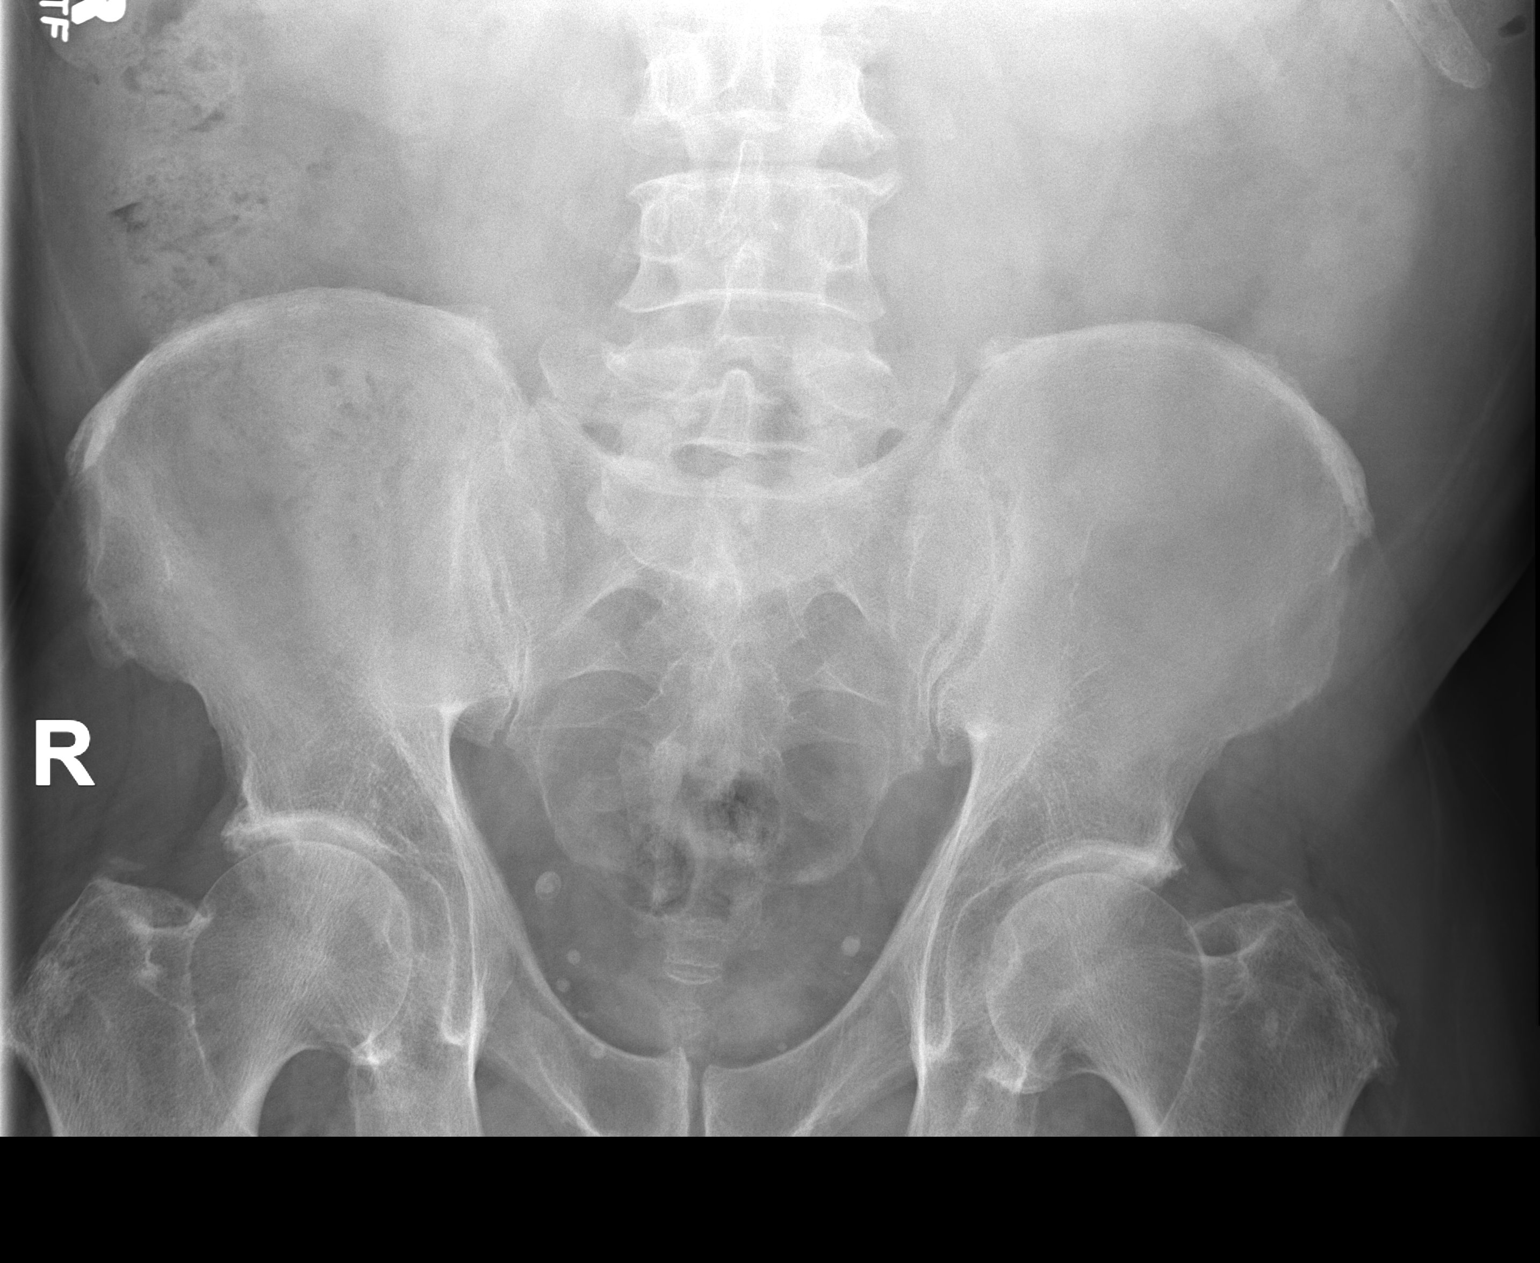

[2 of 2 positions shown; findings below may reference images not displayed]

FINDINGS: No bowel dilatation or evidence of obstruction. No free air or
radiopaque calculi. Degenerative changes of the spine. No acute
osseous pathology. The soft tissues are unremarkable.
IMPRESSION: Negative.

## 2023-09-03 ENCOUNTER — Ambulatory Visit: Payer: Self-pay

## 2024-08-28 ENCOUNTER — Ambulatory Visit: Admitting: Urology
